# Patient Record
Sex: Female | Born: 1980
Health system: Southern US, Community
[De-identification: ages and names within clinical notes are randomized; demographics above are authoritative.]

## PROBLEM LIST (undated history)

## (undated) DIAGNOSIS — N809 Endometriosis, unspecified: Secondary | ICD-10-CM

## (undated) DIAGNOSIS — Z973 Presence of spectacles and contact lenses: Secondary | ICD-10-CM

## (undated) DIAGNOSIS — K219 Gastro-esophageal reflux disease without esophagitis: Secondary | ICD-10-CM

## (undated) DIAGNOSIS — N840 Polyp of corpus uteri: Secondary | ICD-10-CM

## (undated) DIAGNOSIS — Z87410 Personal history of cervical dysplasia: Secondary | ICD-10-CM

## (undated) HISTORY — PX: WISDOM TOOTH EXTRACTION: SHX21

---

## 1999-02-11 ENCOUNTER — Emergency Department (HOSPITAL_COMMUNITY): Admission: EM | Admit: 1999-02-11 | Discharge: 1999-02-11 | Payer: Self-pay | Admitting: Emergency Medicine

## 1999-04-09 ENCOUNTER — Emergency Department (HOSPITAL_COMMUNITY): Admission: EM | Admit: 1999-04-09 | Discharge: 1999-04-09 | Payer: Self-pay | Admitting: Emergency Medicine

## 1999-05-01 ENCOUNTER — Emergency Department (HOSPITAL_COMMUNITY): Admission: EM | Admit: 1999-05-01 | Discharge: 1999-05-01 | Payer: Self-pay | Admitting: Emergency Medicine

## 1999-05-01 ENCOUNTER — Encounter: Payer: Self-pay | Admitting: Emergency Medicine

## 1999-09-14 ENCOUNTER — Emergency Department (HOSPITAL_COMMUNITY): Admission: EM | Admit: 1999-09-14 | Discharge: 1999-09-14 | Payer: Self-pay | Admitting: Emergency Medicine

## 2000-03-18 ENCOUNTER — Emergency Department (HOSPITAL_COMMUNITY): Admission: EM | Admit: 2000-03-18 | Discharge: 2000-03-18 | Payer: Self-pay | Admitting: Emergency Medicine

## 2000-05-10 ENCOUNTER — Emergency Department (HOSPITAL_COMMUNITY): Admission: EM | Admit: 2000-05-10 | Discharge: 2000-05-10 | Payer: Self-pay | Admitting: Emergency Medicine

## 2001-05-10 ENCOUNTER — Emergency Department (HOSPITAL_COMMUNITY): Admission: EM | Admit: 2001-05-10 | Discharge: 2001-05-10 | Payer: Self-pay | Admitting: Emergency Medicine

## 2001-09-21 ENCOUNTER — Other Ambulatory Visit: Admission: RE | Admit: 2001-09-21 | Discharge: 2001-09-21 | Payer: Self-pay | Admitting: Family Medicine

## 2002-05-07 ENCOUNTER — Emergency Department (HOSPITAL_COMMUNITY): Admission: EM | Admit: 2002-05-07 | Discharge: 2002-05-07 | Payer: Self-pay | Admitting: *Deleted

## 2002-07-29 ENCOUNTER — Encounter: Payer: Self-pay | Admitting: Family Medicine

## 2002-07-29 ENCOUNTER — Ambulatory Visit (HOSPITAL_COMMUNITY): Admission: RE | Admit: 2002-07-29 | Discharge: 2002-07-29 | Payer: Self-pay | Admitting: Family Medicine

## 2006-02-19 ENCOUNTER — Emergency Department (HOSPITAL_COMMUNITY): Admission: EM | Admit: 2006-02-19 | Discharge: 2006-02-19 | Payer: Self-pay | Admitting: Family Medicine

## 2006-05-15 ENCOUNTER — Emergency Department (HOSPITAL_COMMUNITY): Admission: EM | Admit: 2006-05-15 | Discharge: 2006-05-15 | Payer: Self-pay | Admitting: Emergency Medicine

## 2006-07-13 ENCOUNTER — Emergency Department (HOSPITAL_COMMUNITY): Admission: EM | Admit: 2006-07-13 | Discharge: 2006-07-13 | Payer: Self-pay | Admitting: Emergency Medicine

## 2006-07-15 ENCOUNTER — Emergency Department (HOSPITAL_COMMUNITY): Admission: EM | Admit: 2006-07-15 | Discharge: 2006-07-15 | Payer: Self-pay | Admitting: Family Medicine

## 2006-08-27 ENCOUNTER — Encounter: Admission: RE | Admit: 2006-08-27 | Discharge: 2006-08-27 | Payer: Self-pay | Admitting: Internal Medicine

## 2008-01-22 ENCOUNTER — Encounter: Admission: RE | Admit: 2008-01-22 | Discharge: 2008-01-22 | Payer: Self-pay | Admitting: Obstetrics and Gynecology

## 2009-05-02 ENCOUNTER — Emergency Department (HOSPITAL_COMMUNITY): Admission: EM | Admit: 2009-05-02 | Discharge: 2009-05-02 | Payer: Self-pay | Admitting: Emergency Medicine

## 2010-04-27 ENCOUNTER — Ambulatory Visit (HOSPITAL_COMMUNITY): Admission: RE | Admit: 2010-04-27 | Discharge: 2010-04-27 | Payer: Self-pay | Admitting: Obstetrics and Gynecology

## 2010-09-16 ENCOUNTER — Encounter: Payer: Self-pay | Admitting: Obstetrics and Gynecology

## 2010-09-16 ENCOUNTER — Encounter: Payer: Self-pay | Admitting: Obstetrics & Gynecology

## 2011-06-11 ENCOUNTER — Other Ambulatory Visit: Payer: Self-pay | Admitting: Obstetrics & Gynecology

## 2011-06-11 NOTE — Patient Instructions (Addendum)
   Your procedure is scheduled on: Tuesday October 23rd  Enter through the Main Entrance of [facility] at: 11:30 am Pick up the phone at the desk and dial [phone] and inform us of your arrival.  Please call this number if you have any problems the morning of surgery: [phone]  Remember: Do not eat food after midnight: Do not drink clear liquids after:9am Tuesday Take these medicines the morning of surgery with a SIP OF WATER:none  Do not wear jewelry, make-up, or FINGER nail polish Do not wear lotions, powders, deodorants, or perfumes. Do not shave 48 hours prior to surgery. Do not bring valuables to the hospital.  Leave suitcase in the car. After Surgery it may be brought to your room. For patients being admitted to the hospital, checkout time is 11:00am the day of discharge.      Remember to use your hibiclens as instructed.Please shower with 1/2 bottle the evening before your surgery and the other 1/2 bottle the morning of surgery.

## 2011-06-17 ENCOUNTER — Encounter (HOSPITAL_COMMUNITY): Payer: Self-pay

## 2011-06-17 ENCOUNTER — Encounter (HOSPITAL_COMMUNITY)
Admission: RE | Admit: 2011-06-17 | Discharge: 2011-06-17 | Disposition: A | Discharge status: Home / Self Care | Payer: Managed Care, Other (non HMO) | Source: Ambulatory Visit | Attending: Obstetrics & Gynecology | Admitting: Obstetrics & Gynecology

## 2011-06-17 HISTORY — DX: Gastro-esophageal reflux disease without esophagitis: K21.9

## 2011-06-17 LAB — CBC
HCT: 35.4 % — ABNORMAL LOW (ref 36.0–46.0)
MCHC: 32.5 g/dL (ref 30.0–36.0)
RDW: 13.5 % (ref 11.5–15.5)
WBC: 7.5 10*3/uL (ref 4.0–10.5)

## 2011-06-17 LAB — SURGICAL PCR SCREEN: MRSA, PCR: NEGATIVE

## 2011-06-18 ENCOUNTER — Encounter (HOSPITAL_COMMUNITY): Payer: Self-pay | Admitting: *Deleted

## 2011-06-18 ENCOUNTER — Encounter (HOSPITAL_COMMUNITY): Payer: Self-pay | Admitting: Anesthesiology

## 2011-06-18 ENCOUNTER — Encounter (HOSPITAL_COMMUNITY)
Admission: RE | Disposition: A | Discharge status: Home / Self Care | Payer: Self-pay | Source: Ambulatory Visit | Attending: Obstetrics & Gynecology

## 2011-06-18 ENCOUNTER — Other Ambulatory Visit: Payer: Self-pay | Admitting: Obstetrics & Gynecology

## 2011-06-18 ENCOUNTER — Ambulatory Visit (HOSPITAL_COMMUNITY): Payer: Managed Care, Other (non HMO) | Admitting: Anesthesiology

## 2011-06-18 ENCOUNTER — Ambulatory Visit (HOSPITAL_COMMUNITY)
Admission: RE | Admit: 2011-06-18 | Discharge: 2011-06-18 | Disposition: A | Discharge status: Home / Self Care | Payer: Managed Care, Other (non HMO) | Source: Ambulatory Visit | Attending: Obstetrics & Gynecology | Admitting: Obstetrics & Gynecology

## 2011-06-18 DIAGNOSIS — N803 Endometriosis of pelvic peritoneum, unspecified: Secondary | ICD-10-CM | POA: Insufficient documentation

## 2011-06-18 DIAGNOSIS — Z01818 Encounter for other preprocedural examination: Secondary | ICD-10-CM | POA: Insufficient documentation

## 2011-06-18 DIAGNOSIS — N946 Dysmenorrhea, unspecified: Secondary | ICD-10-CM | POA: Insufficient documentation

## 2011-06-18 DIAGNOSIS — K6289 Other specified diseases of anus and rectum: Secondary | ICD-10-CM | POA: Insufficient documentation

## 2011-06-18 DIAGNOSIS — Z01812 Encounter for preprocedural laboratory examination: Secondary | ICD-10-CM | POA: Insufficient documentation

## 2011-06-18 HISTORY — PX: OTHER SURGICAL HISTORY: SHX169

## 2011-06-18 LAB — PREGNANCY, URINE: Preg Test, Ur: NEGATIVE

## 2011-06-18 SURGERY — ROBOTIC ASSISTED LAPAROSCOPIC OVARIAN CYSTECTOMY
Anesthesia: General | Site: Abdomen | Wound class: Clean Contaminated

## 2011-06-18 MED ORDER — SUFENTANIL CITRATE 50 MCG/ML IV SOLN
INTRAVENOUS | Status: DC | PRN
  Administered 2011-06-18 (×2): 15 ug via INTRAVENOUS
  Administered 2011-06-18 (×2): 10 ug via INTRAVENOUS

## 2011-06-18 MED ORDER — GLYCOPYRROLATE 0.2 MG/ML IJ SOLN
INTRAMUSCULAR | Status: DC | PRN
  Administered 2011-06-18: .8 mg via INTRAVENOUS

## 2011-06-18 MED ORDER — ONDANSETRON HCL 4 MG/2ML IJ SOLN
INTRAMUSCULAR | Status: DC | PRN
  Administered 2011-06-18: 4 mg via INTRAVENOUS

## 2011-06-18 MED ORDER — DEXAMETHASONE SODIUM PHOSPHATE 10 MG/ML IJ SOLN
INTRAMUSCULAR | Status: AC
  Filled 2011-06-18: qty 1

## 2011-06-18 MED ORDER — CEFAZOLIN SODIUM 1-5 GM-% IV SOLN: 1.0000 g | INTRAVENOUS | Status: DC

## 2011-06-18 MED ORDER — FENTANYL CITRATE 0.05 MG/ML IJ SOLN
INTRAMUSCULAR | Status: AC
Start: 2011-06-18 — End: 2011-06-18
  Administered 2011-06-18: 25 ug via INTRAVENOUS
  Filled 2011-06-18: qty 2

## 2011-06-18 MED ORDER — ACETAMINOPHEN 325 MG PO TABS: 325.0000 mg | ORAL_TABLET | ORAL | Status: DC | PRN

## 2011-06-18 MED ORDER — ROCURONIUM BROMIDE 100 MG/10ML IV SOLN
INTRAVENOUS | Status: DC | PRN
  Administered 2011-06-18: 10 mg via INTRAVENOUS
  Administered 2011-06-18: 50 mg via INTRAVENOUS
  Administered 2011-06-18: 10 mg via INTRAVENOUS

## 2011-06-18 MED ORDER — SUFENTANIL CITRATE 50 MCG/ML IV SOLN
INTRAVENOUS | Status: AC
  Filled 2011-06-18: qty 1

## 2011-06-18 MED ORDER — ROCURONIUM BROMIDE 50 MG/5ML IV SOLN
INTRAVENOUS | Status: AC
  Filled 2011-06-18: qty 1

## 2011-06-18 MED ORDER — PROMETHAZINE HCL 25 MG/ML IJ SOLN: 6.2500 mg | INTRAMUSCULAR | Status: DC | PRN

## 2011-06-18 MED ORDER — LIDOCAINE HCL (CARDIAC) 20 MG/ML IV SOLN
INTRAVENOUS | Status: AC
  Filled 2011-06-18: qty 5

## 2011-06-18 MED ORDER — DEXAMETHASONE SODIUM PHOSPHATE 10 MG/ML IJ SOLN
INTRAMUSCULAR | Status: DC | PRN
  Administered 2011-06-18: 10 mg via INTRAVENOUS

## 2011-06-18 MED ORDER — HETASTARCH-ELECTROLYTES 6 % IV SOLN
INTRAVENOUS | Status: DC | PRN
  Administered 2011-06-18: 14:00:00 via INTRAVENOUS

## 2011-06-18 MED ORDER — ONDANSETRON HCL 4 MG/2ML IJ SOLN
INTRAMUSCULAR | Status: AC
  Filled 2011-06-18: qty 2

## 2011-06-18 MED ORDER — BUPIVACAINE HCL (PF) 0.25 % IJ SOLN
INTRAMUSCULAR | Status: DC | PRN
  Administered 2011-06-18: 16 mL

## 2011-06-18 MED ORDER — HETASTARCH-ELECTROLYTES 6 % IV SOLN
INTRAVENOUS | Status: AC
  Filled 2011-06-18: qty 500

## 2011-06-18 MED ORDER — LACTATED RINGERS IR SOLN
Status: DC | PRN
  Administered 2011-06-18: 3000 mL

## 2011-06-18 MED ORDER — METHYLENE BLUE 1 % INJ SOLN
INTRAMUSCULAR | Status: DC | PRN
  Administered 2011-06-18: 1 mL

## 2011-06-18 MED ORDER — PROPOFOL 10 MG/ML IV EMUL
INTRAVENOUS | Status: DC | PRN
  Administered 2011-06-18: 150 mg via INTRAVENOUS
  Administered 2011-06-18: 20 mg via INTRAVENOUS

## 2011-06-18 MED ORDER — LACTATED RINGERS IV SOLN
INTRAVENOUS | Status: DC
  Administered 2011-06-18 (×3): via INTRAVENOUS

## 2011-06-18 MED ORDER — NEOSTIGMINE METHYLSULFATE 1 MG/ML IJ SOLN
INTRAMUSCULAR | Status: DC | PRN
  Administered 2011-06-18: 4 mg via INTRAVENOUS

## 2011-06-18 MED ORDER — OXYCODONE-ACETAMINOPHEN 7.5-325 MG PO TABS: 1.0000 | ORAL_TABLET | ORAL | Status: AC | PRN

## 2011-06-18 MED ORDER — FENTANYL CITRATE 0.05 MG/ML IJ SOLN
25.0000 ug | INTRAMUSCULAR | Status: DC | PRN
  Administered 2011-06-18 (×4): 25 ug via INTRAVENOUS

## 2011-06-18 MED ORDER — KETOROLAC TROMETHAMINE 30 MG/ML IJ SOLN
INTRAMUSCULAR | Status: DC | PRN
  Administered 2011-06-18: 30 mg via INTRAVENOUS

## 2011-06-18 MED ORDER — PROPOFOL 10 MG/ML IV EMUL
INTRAVENOUS | Status: AC
  Filled 2011-06-18: qty 40

## 2011-06-18 MED ORDER — MIDAZOLAM HCL 5 MG/5ML IJ SOLN
INTRAMUSCULAR | Status: DC | PRN
  Administered 2011-06-18: 2 mg via INTRAVENOUS

## 2011-06-18 MED ORDER — KETOROLAC TROMETHAMINE 30 MG/ML IJ SOLN: 15.0000 mg | Freq: Once | INTRAMUSCULAR | Status: DC | PRN

## 2011-06-18 MED ORDER — CEFAZOLIN SODIUM 1-5 GM-% IV SOLN
INTRAVENOUS | Status: AC
  Administered 2011-06-18: 1 g via INTRAVENOUS
  Filled 2011-06-18: qty 50

## 2011-06-18 MED ORDER — NEOSTIGMINE METHYLSULFATE 1 MG/ML IJ SOLN
INTRAMUSCULAR | Status: AC
  Filled 2011-06-18: qty 10

## 2011-06-18 MED ORDER — KETOROLAC TROMETHAMINE 30 MG/ML IJ SOLN
INTRAMUSCULAR | Status: AC
  Filled 2011-06-18: qty 1

## 2011-06-18 MED ORDER — MIDAZOLAM HCL 2 MG/2ML IJ SOLN
INTRAMUSCULAR | Status: AC
  Filled 2011-06-18: qty 2

## 2011-06-18 MED ORDER — LIDOCAINE HCL (CARDIAC) 20 MG/ML IV SOLN
INTRAVENOUS | Status: DC | PRN
  Administered 2011-06-18: 60 mg via INTRAVENOUS

## 2011-06-18 MED ORDER — GLYCOPYRROLATE 0.2 MG/ML IJ SOLN
INTRAMUSCULAR | Status: AC
  Filled 2011-06-18: qty 2

## 2011-06-18 SURGICAL SUPPLY — 47 items
ADH SKN CLS APL DERMABOND .7 (GAUZE/BANDAGES/DRESSINGS) ×3
BARRIER ADHS 3X4 INTERCEED (GAUZE/BANDAGES/DRESSINGS) ×3 IMPLANT
BRR ADH 4X3 ABS CNTRL BYND (GAUZE/BANDAGES/DRESSINGS) ×2
CABLE HIGH FREQUENCY MONO STRZ (ELECTRODE) ×2 IMPLANT
CLOTH BEACON ORANGE TIMEOUT ST (SAFETY) ×2 IMPLANT
CONT PATH 16OZ SNAP LID 3702 (MISCELLANEOUS) ×2 IMPLANT
COVER MAYO STAND STRL (DRAPES) ×2 IMPLANT
COVER TABLE BACK 60X90 (DRAPES) ×4 IMPLANT
COVER TIP SHEARS 8 DVNC (MISCELLANEOUS) ×1 IMPLANT
COVER TIP SHEARS 8MM DA VINCI (MISCELLANEOUS) ×1
DECANTER SPIKE VIAL GLASS SM (MISCELLANEOUS) ×2 IMPLANT
DERMABOND ADVANCED (GAUZE/BANDAGES/DRESSINGS) ×3
DERMABOND ADVANCED .7 DNX12 (GAUZE/BANDAGES/DRESSINGS) IMPLANT
DRAPE HUG U DISPOSABLE (DRAPE) ×2 IMPLANT
DRAPE LG THREE QUARTER DISP (DRAPES) ×4 IMPLANT
DRAPE MONITOR DA VINCI (DRAPE) ×2 IMPLANT
DRAPE WARM FLUID 44X44 (DRAPE) ×2 IMPLANT
ELECT REM PT RETURN 9FT ADLT (ELECTROSURGICAL) ×2
ELECTRODE REM PT RTRN 9FT ADLT (ELECTROSURGICAL) ×1 IMPLANT
EVACUATOR SMOKE 8.L (FILTER) ×2 IMPLANT
GLOVE BIO SURGEON STRL SZ 6.5 (GLOVE) ×10 IMPLANT
GOWN PREVENTION PLUS LG XLONG (DISPOSABLE) ×16 IMPLANT
KIT DISP ACCESSORY 4 ARM (KITS) ×2 IMPLANT
MATRIX HEMOSTAT SURGIFLO (HEMOSTASIS) ×2 IMPLANT
PACK LAVH (CUSTOM PROCEDURE TRAY) ×2 IMPLANT
PAD PREP 24X48 CUFFED NSTRL (MISCELLANEOUS) ×4 IMPLANT
POSITIONER SURGICAL ARM (MISCELLANEOUS) ×4 IMPLANT
SET IRRIG TUBING LAPAROSCOPIC (IRRIGATION / IRRIGATOR) ×3 IMPLANT
SOLUTION ELECTROLUBE (MISCELLANEOUS) ×2 IMPLANT
SURGIFLO TRUKIT (HEMOSTASIS) ×2 IMPLANT
SUT VIC AB 3-0 SH 27 (SUTURE)
SUT VIC AB 3-0 SH 27X BRD (SUTURE) IMPLANT
SUT VIC AB 4-0 PS2 27 (SUTURE) ×4 IMPLANT
SUT VICRYL 0 UR6 27IN ABS (SUTURE) ×4 IMPLANT
SYR 50ML LL SCALE MARK (SYRINGE) ×3 IMPLANT
SYSTEM CONVERTIBLE TROCAR (TROCAR) ×1 IMPLANT
TIP UTERINE 6.7X6CM WHT DISP (MISCELLANEOUS) IMPLANT
TIP UTERINE 6.7X8CM BLUE DISP (MISCELLANEOUS) ×1 IMPLANT
TOWEL OR 17X24 6PK STRL BLUE (TOWEL DISPOSABLE) ×6 IMPLANT
TRAY FOLEY BAG SILVER LF 14FR (CATHETERS) ×2 IMPLANT
TROCAR 12M 150ML BLUNT (TROCAR) ×1 IMPLANT
TROCAR DISP BLADELESS 8 DVNC (TROCAR) ×1 IMPLANT
TROCAR DISP BLADELESS 8MM (TROCAR) ×1
TROCAR XCEL NON-BLD 5MMX100MML (ENDOMECHANICALS) ×2 IMPLANT
TUBING FILTER THERMOFLATOR (ELECTROSURGICAL) ×4 IMPLANT
WARMER LAPAROSCOPE (MISCELLANEOUS) ×2 IMPLANT
WATER STERILE IRR 1000ML POUR (IV SOLUTION) ×6 IMPLANT

## 2011-06-18 NOTE — Op Note (Signed)
06/18/2011  2:54 PM  PATIENT:  Kristine Cummings  30 y.o. female  PRE-OPERATIVE DIAGNOSIS:  Dysmenorrhea, Rectal Pain, Possible Endometriosis  POST-OPERATIVE DIAGNOSIS:  Dysmenorrhea, Rectal Pain, Stage 3 Endometriosis with adhesions at Cecum and Appendix.  PROCEDURE:  Procedure(s): DX LPS, ROBOTIC ASSISTED LAPAROSCOPIC RESECTION OF ENDOMETRIOSIS AND LYSIS OF CECAL AND APPENDICEAL ADHESIONS.  SURGEON:  Surgeon(s): [name] Domanique Luckett Sheronette Cathie Beams, MD  ASSISTANTS: Serita Kyle, MD   ANESTHESIA:   general  Procedure: Under general anesthesia with endotracheal intubation. The patient is in lithotomy position. She is prepped with Betadine on the abdominal, suprapubic, vulvar and vaginal areas. She is draped as usual.  On vaginal exam the uterus is anteverted normal volume but nodules are palpable in the posterior Cul-de-sac.  No adnexal mass is felt.  The small Ko-ring with a #8 roomy are put in place without difficulty. The supra-umbilical area is infiltrated with Marcaine one quarter plain. A 1.5 cm incision is made with a scalpel at that level.  The aponeurosis is opened with Mayo scissors and the parietal peritoneum is opened with Mayo scissors as well. A pursestring stitch of Vicryl 0 is applied at the aponeurosis. The Roseanne Reno is inserted at that level. A pneumoperitoneum is created with CO2. The camera is inserted.  We note lesions of endometriosis at the level of the uterosacral ligaments. The appendix is adherent to the right adnexa. The decision is therefore taken to on use robotic assistance. A semicircular configuration is used for port placement. Each site is infiltrated with Marcaine one quarter plain. The scalpel was used to make 5 and 8mm incisions. 2 robotic ports are inserted on the right, one robotic port on the lower left and the assistant port in the upper left.  All trochars are inserted under direct vision. The robot his docked from the right side without difficulty. We  then go to the console. The Endo Shears scissor is used and the right arm, the PK in the left arm and the Prograsp in the third arm.  The appendix is freed from the right adnexa via meticulous sharp dissection.  No lesion of endometriosis is noted on the appendix. At the site where the appendix was released a deep lesion of endometriosis measuring 2 cm is present. This lesion lies between the right round ligament and the right tube.  It is completely ressected. We then proceed with resection of a deep right anterior lesion.  This lesion measures about 2. It was completely ressected.  We then addressed the posterior cul-de-sac. The posterior cul-de-sac is partially obliterated.  The Cecum is coming up densely adherent to the back of the uterus all the way above the uterosacral ligaments.  We start by resecting the lesion of endometriosis above the site of adhesion of the cecum.  A 3 cm deep lesion of endometriosis from the left uterosacral to the right was ressected.  The cecum was then safely descended as low as possible. An EEA was used to stay safely away from the cecal wall. At the end of the lysis of adhesions the cecum was in normal anatomic position in terms of being descended but it was somewhat loopy.  A per-op consultation with general surgery was requested to allow them to see the current position of the cecum in case future re-intervention is necessary, but Dr. Dwain Sarna called back stating that he was not available.  A small 1 cm superficial lesion of endometriosis was also ressected from the small bowel on the right.  The score for endometriosis was 18, per the revised scoring system of the fertility Society.  That gave a stage III pelvic endometriosis. We performed Methylene Blue hydrotubation. The tubes were patent on both sides.  No lesion was were present on the ovaries. And the uterus was otherwise normal. The liver and the rest of the abdomen were normal. We irrigated and suctioned the  abdominopelvic cavities. Hemostasis was adequate at all levels. We therefore removed the robotic instruments, undocked the robot.  We then went by laparoscopy. Suctioned the pelvic cavity further after removing the Trendelenburg. We used Surgi-Flo for hemostasis. And then Interceed for adhesion barrier.  Pictures were taken before and after the resection of endometriosis and lysis of adhesions. All biopsy specimens were sent to pathology. The instruments were removed. All ports were removed under direct vision. The CO2 was evacuated. The pursestring stitch was attached at the supraumbilical incision. We used Monocril 4-0 in a subcuticular stitch to close all incisions.  We added Dermabond on top of each incision. The instruments were removed from the vagina. The patient was brought to the PACU in good and stable condition.  ESTIMATED BLOOD LOSS: 75 cc   Intake/Output Summary (Last 24 hours) at 06/18/11 1454 Last data filed at 06/18/11 1443  Gross per 24 hour  Intake   1500 ml  Output    105 ml  Net   1395 ml     BLOOD ADMINISTERED:none   LOCAL MEDICATIONS USED:  MARCAIN 20 CC  SPECIMEN:  Source of Specimen:  Pelvic Endometriotic lesions, excision biopsies.  DISPOSITION OF SPECIMEN:  PATHOLOGY  COUNTS:  YES   PLAN OF CARE: Transfer to PACU    Genia Del MD

## 2011-06-18 NOTE — Discharge Summary (Signed)
  Physician Discharge Summary  Patient ID: Kristine Cummings MRN: [id number] DOB/AGE: [birth year] 31 y.o.  Admit date: 06/18/2011 Discharge date: 06/18/2011  Admission Diagnoses: Dysmenorrhea, Rectal Pain, Possible Endometriosis  Discharge Diagnoses: Dysmenorrhea, Rectal Pain, Possible Endometriosis        Active Problems:  * No active hospital problems. *    Discharged Condition: good  Hospital Course:   Consults: general surgery per op (Phone discussion, Dr Dwain Sarna not able to come) Treatments: surgery  Disposition: Final discharge disposition not confirmed   Current Discharge Medication List    START taking these medications   Details  oxyCODONE-acetaminophen (PERCOCET) 7.5-325 MG per tablet Take 1 tablet by mouth every 4 (four) hours as needed for pain. Qty: 30 tablet, Refills: 0       Follow-up Information    Follow up with [name],[name] in 3 weeks.   Contact information:   [address] [place] [address] [phone]          Signed: Genia Del 06/18/2011, 3:31 PM

## 2011-06-18 NOTE — H&P (Signed)
Kristine Cummings is an 30 y.o. female G0  RP:  Pelvic pain with painful BMs and dysmenorrhea         R/O pelvic endometriosis.  Dx LPS, possible da Vinci assisted conservative Rx of endometriosis, lysis of adhesions.  Pertinent Gynecological History: Menses: flow is moderate Contraception: condoms Blood transfusions: none Sexually transmitted diseases: no past history, except h/o CIN 1 in 2005-2006. Previous GYN Procedures: None  Last pap: wnl  OB History: G0   Menstrual History:  No LMP recorded.    Past Medical History  Diagnosis Date  . GERD (gastroesophageal reflux disease)     uses otc nexium if needed    Past Surgical History  Procedure Date  . Wisdom tooth extraction     as teen    No family history on file.  Social History:  reports that she has been smoking Cigarettes.  She has a 8 pack-year smoking history. She does not have any smokeless tobacco history on file. She reports that she drinks alcohol. She reports that she does not use illicit drugs.  Allergies: No Known Allergies  No prescriptions prior to admission    Blood pressure 109/58, pulse 66, temperature 98.1 F (36.7 C), temperature source Oral, resp. rate 18, SpO2 100.00%.  Results for orders placed during the hospital encounter of 06/17/11 (from the past 24 hour(s))  SURGICAL PCR SCREEN     Status: Normal   Collection Time   06/17/11  3:40 PM      Component Value Range   MRSA, PCR NEGATIVE  NEGATIVE    Staphylococcus aureus NEGATIVE  NEGATIVE   CBC     Status: Abnormal   Collection Time   06/17/11  3:40 PM      Component Value Range   WBC 7.5  4.0 - 10.5 (K/uL)   RBC 4.07  3.87 - 5.11 (MIL/uL)   Hemoglobin 11.5 (*) 12.0 - 15.0 (g/dL)   HCT 95.6 (*) 21.3 - 46.0 (%)   MCV 87.0  78.0 - 100.0 (fL)   MCH 28.3  26.0 - 34.0 (pg)   MCHC 32.5  30.0 - 36.0 (g/dL)   RDW 08.6  57.8 - 46.9 (%)   Platelets 245  150 - 400 (K/uL)    No results found.  Pelvic US neg 06/04/11  Assessment/Plan: Pelvic  pain, pain with BMs and dysmenorrhea.  Dx LPS, conservative Rx of endometriosis, possible lysis of adhesion.  Surgery and risks reviewed.  Nikola Blackston,[name] 06/18/2011, 11:42 AM

## 2011-06-18 NOTE — Transfer of Care (Signed)
Immediate Anesthesia Transfer of Care Note  Patient: Kristine Cummings  Procedure(s) Performed:  ROBOTIC ASSISTED LAPAROSCOPIC OVARIAN CYSTECTOMY  Patient Location: PACU  Anesthesia Type: General  Level of Consciousness: oriented and sedated  Airway & Oxygen Therapy: Patient Spontanous Breathing and Patient connected to nasal cannula oxygen  Post-op Assessment: Report given to PACU RN and Post -op Vital signs reviewed and stable  Post vital signs: stable  Complications: No apparent anesthesia complications

## 2011-06-18 NOTE — Anesthesia Postprocedure Evaluation (Signed)
Anesthesia Post Note  Patient: Kristine Cummings  Procedure(s) Performed:  ROBOTIC ASSISTED LAPAROSCOPIC OVARIAN CYSTECTOMY  Anesthesia type: General  Patient location: PACU  Post pain: Pain level controlled  Post assessment: Post-op Vital signs reviewed  Last Vitals:  Filed Vitals:   06/18/11 1615  BP: 102/59  Pulse: 70  Temp:   Resp: 21    Post vital signs: Reviewed  Level of consciousness: sedated  Complications: No apparent anesthesia complicationsfj

## 2011-06-18 NOTE — Anesthesia Preprocedure Evaluation (Addendum)
Anesthesia Evaluation  Patient identified by MRN, date of birth, ID band Patient awake  General Assessment Comment  Reviewed: Allergy & Precautions, H&P , Patient's Chart, lab work & pertinent test results, reviewed documented beta blocker date and time   History of Anesthesia Complications Negative for: history of anesthetic complications  Airway Mallampati: II TM Distance: >3 FB Neck ROM: full    Dental No notable dental hx.    Pulmonary  clear to auscultation  Pulmonary exam normal       Cardiovascular Exercise Tolerance: Good regular Normal    Neuro/Psych Negative Neurological ROS  Negative Psych ROS   GI/Hepatic negative GI ROS Neg liver ROS  GERD   Endo/Other  Negative Endocrine ROS  Renal/GU negative Renal ROS     Musculoskeletal   Abdominal   Peds  Hematology negative hematology ROS (+)   Anesthesia Other Findings smoker  Reproductive/Obstetrics negative OB ROS                           Anesthesia Physical Anesthesia Plan  ASA: II  Anesthesia Plan: General   Post-op Pain Management:    Induction:   Airway Management Planned:   Additional Equipment:   Intra-op Plan:   Post-operative Plan:   Informed Consent: I have reviewed the patients History and Physical, chart, labs and discussed the procedure including the risks, benefits and alternatives for the proposed anesthesia with the patient or authorized representative who has indicated his/her understanding and acceptance.   Dental Advisory Given  Plan Discussed with: CRNA and Surgeon  Anesthesia Plan Comments:        Anesthesia Quick Evaluation

## 2012-06-02 ENCOUNTER — Other Ambulatory Visit: Payer: Self-pay | Admitting: Obstetrics and Gynecology

## 2012-06-02 DIAGNOSIS — N63 Unspecified lump in unspecified breast: Secondary | ICD-10-CM

## 2012-06-08 ENCOUNTER — Ambulatory Visit
Admission: RE | Admit: 2012-06-08 | Discharge: 2012-06-08 | Disposition: A | Discharge status: Home / Self Care | Payer: Managed Care, Other (non HMO) | Source: Ambulatory Visit | Attending: Obstetrics and Gynecology | Admitting: Obstetrics and Gynecology

## 2012-06-08 DIAGNOSIS — N63 Unspecified lump in unspecified breast: Secondary | ICD-10-CM

## 2014-05-26 ENCOUNTER — Emergency Department (INDEPENDENT_AMBULATORY_CARE_PROVIDER_SITE_OTHER)
Admission: EM | Admit: 2014-05-26 | Discharge: 2014-05-26 | Disposition: A | Discharge status: Home / Self Care | Payer: Self-pay | Source: Home / Self Care | Attending: Family Medicine | Admitting: Family Medicine

## 2014-05-26 ENCOUNTER — Encounter (HOSPITAL_COMMUNITY): Payer: Self-pay | Admitting: Emergency Medicine

## 2014-05-26 DIAGNOSIS — Z041 Encounter for examination and observation following transport accident: Secondary | ICD-10-CM

## 2014-05-26 MED ORDER — CYCLOBENZAPRINE HCL 5 MG PO TABS: 5.0000 mg | ORAL_TABLET | Freq: Three times a day (TID) | ORAL | Status: DC | PRN

## 2014-05-26 MED ORDER — IBUPROFEN 800 MG PO TABS: 800.0000 mg | ORAL_TABLET | Freq: Three times a day (TID) | ORAL | Status: DC
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
## 2014-08-22 ENCOUNTER — Other Ambulatory Visit: Payer: Self-pay | Admitting: Obstetrics and Gynecology

## 2014-08-22 DIAGNOSIS — N6310 Unspecified lump in the right breast, unspecified quadrant: Secondary | ICD-10-CM

## 2014-08-31 ENCOUNTER — Other Ambulatory Visit: Payer: Managed Care, Other (non HMO)

## 2014-08-31 ENCOUNTER — Other Ambulatory Visit: Payer: Self-pay | Admitting: Obstetrics and Gynecology

## 2014-08-31 ENCOUNTER — Ambulatory Visit
Admission: RE | Admit: 2014-08-31 | Discharge: 2014-08-31 | Disposition: A | Discharge status: Home / Self Care | Payer: BLUE CROSS/BLUE SHIELD | Source: Ambulatory Visit

## 2014-08-31 ENCOUNTER — Ambulatory Visit
Admission: RE | Admit: 2014-08-31 | Discharge: 2014-08-31 | Disposition: A | Discharge status: Home / Self Care | Payer: BLUE CROSS/BLUE SHIELD | Source: Ambulatory Visit | Attending: Obstetrics and Gynecology | Admitting: Obstetrics and Gynecology

## 2014-08-31 DIAGNOSIS — N6320 Unspecified lump in the left breast, unspecified quadrant: Secondary | ICD-10-CM

## 2014-08-31 DIAGNOSIS — N6310 Unspecified lump in the right breast, unspecified quadrant: Secondary | ICD-10-CM

## 2014-10-25 HISTORY — PX: OTHER SURGICAL HISTORY: SHX169

## 2015-02-17 ENCOUNTER — Encounter (HOSPITAL_BASED_OUTPATIENT_CLINIC_OR_DEPARTMENT_OTHER): Payer: Self-pay | Admitting: *Deleted

## 2015-02-17 NOTE — Progress Notes (Signed)
NPO AFTER MN.  ARRIVE AT 1100.  NEEDS HG AND URINE PREG.  WILL TAKE REFLUX MED ( NEXIUM OR PRILOSEC OTC) AM DOS  W/ SIPS OF WATER.

## 2015-02-21 NOTE — H&P (Addendum)
Kristine Cummings is a 34 y.o. female , originally referred to me by Dr. Cherly Hensen, for endometriosis infertility.  She was diagnosed with an endometrial polyp by recent endometrial biopsy. She reports abnormal uterine bleeding x 2 months.  Pertinent Gynecological History: Menses: Normal Bleeding: dysfunctional uterine bleeding Contraception: none DES exposure: denies Blood transfusions: none Sexually transmitted diseases: no past history Previous GYN Procedures: Laparoscopy, TVOR  Last mammogram: normal Last pap: normal  OB History: G 0   Menstrual History: Menarche age: 80 No LMP recorded.    Past Medical History  Diagnosis Date  . Endometrial polyp   . Endometriosis   . History of cervical dysplasia     CIN I  in 2005-2006  . Wears contact lenses   . GERD (gastroesophageal reflux disease)     uses otc nexium or prilosec if needed                    Past Surgical History  Procedure Laterality Date  . Wisdom tooth extraction  as teen  . Robotic assisted laparoscopy / resection endometriosis/  lysis cecal and appendiceal adhesions  06-18-2011  . Egg retrieval w/ propofol  march 2016             History reviewed. No pertinent family history. No hereditary disease.  No cancer of breast, ovary, uterus. No cutaneous leiomyomatosis or renal cell carcinoma.  History   Social History  . Marital Status: Single    Spouse Name: N/A  . Number of Children: N/A  . Years of Education: N/A   Occupational History  . Not on file.   Social History Main Topics  . Smoking status: Current Every Day Smoker -- 0.00 packs/day for 15 years    Types: Cigars  . Smokeless tobacco: Never Used     Comment: currently smokes 2 cigars daily/  quit 1ppd cigarettes june 2015  . Alcohol Use: Yes     Comment: occasional  . Drug Use: No  . Sexual Activity: Not on file   Other Topics Concern  . Not on file   Social History Narrative    No Known Allergies  No current  facility-administered medications on file prior to encounter.   No current outpatient prescriptions on file prior to encounter.     Review of Systems  Constitutional: Negative.   HENT: Negative.   Eyes: Negative.   Respiratory: Negative.   Cardiovascular: Negative.   Gastrointestinal: Negative.   Genitourinary: Negative.   Musculoskeletal: Negative.   Skin: Negative.   Neurological: Negative.   Endo/Heme/Allergies: Negative.   Psychiatric/Behavioral: Negative.      Physical Exam  Ht  (1.651 m)  Wt 63.957 kg (141 lb)  BMI 23.46 kg/m2  LMP 02/03/2015 (Approximate) Constitutional: She is oriented to person, place, and time. She appears well-developed and well-nourished.  HENT:  Head: Normocephalic and atraumatic.  Nose: Nose normal.  Mouth/Throat: Oropharynx is clear and moist. No oropharyngeal exudate.  Eyes: Conjunctivae normal and EOM are normal. Pupils are equal, round, and reactive to light. No scleral icterus.  Neck: Normal range of motion. Neck supple. No tracheal deviation present. No thyromegaly present.  Cardiovascular: Normal rate.   Respiratory: Effort normal and breath sounds normal.  GI: Soft. Bowel sounds are normal. She exhibits no distension and no mass. There is no tenderness.  Lymphadenopathy:    She has no cervical adenopathy.  Neurological: She is alert and oriented to person, place, and time. She has normal reflexes.  Skin:  Skin is warm.  Psychiatric: She has a normal mood and affect. Her behavior is normal. Judgment and thought content normal.       Assessment/Plan:  Patient will have a hysteroscopy, polypectomy. She will follow up with my office when ready to pursue frozen embryo transfer.

## 2015-02-22 ENCOUNTER — Ambulatory Visit (HOSPITAL_BASED_OUTPATIENT_CLINIC_OR_DEPARTMENT_OTHER)
Admission: RE | Admit: 2015-02-22 | Discharge: 2015-02-22 | Disposition: A | Discharge status: Home / Self Care | Payer: BLUE CROSS/BLUE SHIELD | Source: Ambulatory Visit | Attending: Obstetrics and Gynecology | Admitting: Obstetrics and Gynecology

## 2015-02-22 ENCOUNTER — Encounter (HOSPITAL_BASED_OUTPATIENT_CLINIC_OR_DEPARTMENT_OTHER): Payer: Self-pay

## 2015-02-22 ENCOUNTER — Encounter (HOSPITAL_BASED_OUTPATIENT_CLINIC_OR_DEPARTMENT_OTHER)
Admission: RE | Disposition: A | Discharge status: Home / Self Care | Payer: Self-pay | Source: Ambulatory Visit | Attending: Obstetrics and Gynecology

## 2015-02-22 ENCOUNTER — Ambulatory Visit (HOSPITAL_BASED_OUTPATIENT_CLINIC_OR_DEPARTMENT_OTHER): Payer: BLUE CROSS/BLUE SHIELD | Admitting: Anesthesiology

## 2015-02-22 DIAGNOSIS — K219 Gastro-esophageal reflux disease without esophagitis: Secondary | ICD-10-CM | POA: Insufficient documentation

## 2015-02-22 DIAGNOSIS — F17210 Nicotine dependence, cigarettes, uncomplicated: Secondary | ICD-10-CM | POA: Diagnosis not present

## 2015-02-22 DIAGNOSIS — N840 Polyp of corpus uteri: Secondary | ICD-10-CM | POA: Diagnosis not present

## 2015-02-22 DIAGNOSIS — N870 Mild cervical dysplasia: Secondary | ICD-10-CM | POA: Insufficient documentation

## 2015-02-22 DIAGNOSIS — Z5309 Procedure and treatment not carried out because of other contraindication: Secondary | ICD-10-CM | POA: Insufficient documentation

## 2015-02-22 DIAGNOSIS — N978 Female infertility of other origin: Secondary | ICD-10-CM | POA: Diagnosis not present

## 2015-02-22 HISTORY — DX: Presence of spectacles and contact lenses: Z97.3

## 2015-02-22 HISTORY — DX: Personal history of cervical dysplasia: Z87.410

## 2015-02-22 HISTORY — DX: Endometriosis, unspecified: N80.9

## 2015-02-22 HISTORY — DX: Polyp of corpus uteri: N84.0

## 2015-02-22 LAB — TYPE AND SCREEN
ABO/RH(D): A POS
ANTIBODY SCREEN: NEGATIVE

## 2015-02-22 LAB — ABO/RH: ABO/RH(D): A POS

## 2015-02-22 SURGERY — HYSTEROSCOPY
Anesthesia: General

## 2015-02-22 MED ORDER — LACTATED RINGERS IV SOLN
INTRAVENOUS | Status: DC
  Filled 2015-02-22: qty 1000

## 2015-02-22 MED ORDER — MIDAZOLAM HCL 2 MG/2ML IJ SOLN
INTRAMUSCULAR | Status: AC
  Filled 2015-02-22: qty 2

## 2015-02-22 MED ORDER — CEFAZOLIN SODIUM-DEXTROSE 2-3 GM-% IV SOLR
INTRAVENOUS | Status: AC
  Filled 2015-02-22: qty 50

## 2015-02-22 MED ORDER — CEFAZOLIN SODIUM-DEXTROSE 2-3 GM-% IV SOLR
2.0000 g | INTRAVENOUS | Status: DC
  Filled 2015-02-22: qty 50

## 2015-02-22 MED ORDER — FENTANYL CITRATE (PF) 100 MCG/2ML IJ SOLN
INTRAMUSCULAR | Status: AC
  Filled 2015-02-22: qty 4

## 2015-02-22 SURGICAL SUPPLY — 40 items
CANISTER SUCTION 2500CC (MISCELLANEOUS) ×1 IMPLANT
CANNULA CURETTE W/SYR 6 (CANNULA) IMPLANT
CANNULA CURETTE W/SYR 7 (CANNULA) IMPLANT
CATH HSG 5FRX28CM (CATHETERS) IMPLANT
CATH INTRA ACCESS BALLN (BALLOONS) IMPLANT
CATH ROBINSON RED A/P 16FR (CATHETERS) ×1 IMPLANT
CATH SALPINGOGRAPHY SELECT (BALLOONS) IMPLANT
CATH SSG INJECTION W/GUIDEWIRE (BALLOONS) IMPLANT
CORD ACTIVE DISPOSABLE (ELECTRODE)
CORD ELECTRO ACTIVE DISP (ELECTRODE) IMPLANT
COVER BACK TABLE 60X90IN (DRAPES) ×1 IMPLANT
DRAPE LG THREE QUARTER DISP (DRAPES) IMPLANT
ELECT LOOP GYNE PRO 24FR (CUTTING LOOP)
ELECT REM PT RETURN 9FT ADLT (ELECTROSURGICAL)
ELECT VAPORTRODE GRVD BAR (ELECTRODE) IMPLANT
ELECTRODE KNIFE SHAPED 22FR (ELECTROSURGICAL) IMPLANT
ELECTRODE LOOP GYNE PRO 24FR (CUTTING LOOP) IMPLANT
ELECTRODE REM PT RTRN 9FT ADLT (ELECTROSURGICAL) IMPLANT
GLOVE BIO SURGEON STRL SZ8 (GLOVE) ×1 IMPLANT
GLOVE BIOGEL PI IND STRL 8.5 (GLOVE) ×1 IMPLANT
GLOVE BIOGEL PI INDICATOR 8.5 (GLOVE)
GOWN STRL REUS W/ TWL XL LVL3 (GOWN DISPOSABLE) ×2 IMPLANT
GOWN STRL REUS W/TWL XL LVL3 (GOWN DISPOSABLE)
LEGGING LITHOTOMY PAIR STRL (DRAPES) ×1 IMPLANT
LOOP ANGLED CUTTING 22FR (CUTTING LOOP) IMPLANT
MANIFOLD NEPTUNE II (INSTRUMENTS) IMPLANT
NDL SPNL 22GX3.5 QUINCKE BK (NEEDLE) ×1 IMPLANT
NEEDLE SPNL 22GX3.5 QUINCKE BK (NEEDLE) IMPLANT
PACK BASIN DAY SURGERY FS (CUSTOM PROCEDURE TRAY) ×1 IMPLANT
PAD OB MATERNITY 4.3X12.25 (PERSONAL CARE ITEMS) ×1 IMPLANT
SET IRRIG Y TYPE TUR BLADDER L (SET/KITS/TRAYS/PACK) IMPLANT
STENT BALLN UTERINE 4CM 6FR (STENTS) IMPLANT
SUT SILK 2 0 SH (SUTURE) IMPLANT
SUT SILK 3 0 PS 1 (SUTURE) IMPLANT
SYR 20CC LL (SYRINGE) IMPLANT
SYR 3ML 18GX1 1/2 (SYRINGE) ×1 IMPLANT
TOWEL OR 17X24 6PK STRL BLUE (TOWEL DISPOSABLE) ×2 IMPLANT
TRAY DSU PREP LF (CUSTOM PROCEDURE TRAY) ×1 IMPLANT
TUBE CONNECTING 12X1/4 (SUCTIONS) IMPLANT
WATER STERILE IRR 500ML POUR (IV SOLUTION) ×1 IMPLANT

## 2015-02-22 NOTE — Anesthesia Preprocedure Evaluation (Addendum)
Anesthesia Evaluation  Patient identified by MRN, date of birth, ID band Patient awake    Reviewed: Allergy & Precautions, H&P , Patient's Chart, lab work & pertinent test results, reviewed documented beta blocker date and time   History of Anesthesia Complications Negative for: history of anesthetic complications  Airway Mallampati: II  TM Distance: >3 FB Neck ROM: full    Dental no notable dental hx. (+) Dental Advisory Given, Teeth Intact   Pulmonary neg pulmonary ROS, Current Smoker,  breath sounds clear to auscultation  Pulmonary exam normal       Cardiovascular Exercise Tolerance: Good negative cardio ROS Normal cardiovascular examRhythm:regular Rate:Normal     Neuro/Psych negative neurological ROS  negative psych ROS   GI/Hepatic negative GI ROS, Neg liver ROS, GERD-  Medicated and Controlled,  Endo/Other  negative endocrine ROS  Renal/GU negative Renal ROS     Musculoskeletal   Abdominal   Peds  Hematology negative hematology ROS (+)   Anesthesia Other Findings smoker  Reproductive/Obstetrics negative OB ROS                            Anesthesia Physical Anesthesia Plan  ASA: II  Anesthesia Plan: General   Post-op Pain Management:    Induction: Intravenous, Rapid sequence and Cricoid pressure planned  Airway Management Planned: Oral ETT  Additional Equipment:   Intra-op Plan:   Post-operative Plan: Extubation in OR  Informed Consent:   Plan Discussed with: Surgeon  Anesthesia Plan Comments:        Anesthesia Quick Evaluation

## 2015-02-22 NOTE — Progress Notes (Signed)
Surgery canceled by anesthesia, drank 3 sips per patient of coffee drink with milk ato 1030.

## 2015-02-23 LAB — POCT PREGNANCY, URINE: PREG TEST UR: NEGATIVE

## 2015-03-15 ENCOUNTER — Encounter (HOSPITAL_BASED_OUTPATIENT_CLINIC_OR_DEPARTMENT_OTHER): Payer: Self-pay | Admitting: *Deleted

## 2015-03-15 NOTE — Progress Notes (Signed)
NPO AFTER MN WITH EXCEPTION WATER/ GATORADE UNTIL 0700.  ARRIVE AT 1200.  NEEDS HG AND URINE PREG. WILL TAKE REFLUX MED. AM DOS W/SIPS OF WATER.

## 2015-03-17 ENCOUNTER — Ambulatory Visit (HOSPITAL_BASED_OUTPATIENT_CLINIC_OR_DEPARTMENT_OTHER): Payer: BLUE CROSS/BLUE SHIELD | Admitting: Anesthesiology

## 2015-03-17 ENCOUNTER — Ambulatory Visit (HOSPITAL_BASED_OUTPATIENT_CLINIC_OR_DEPARTMENT_OTHER)
Admission: RE | Admit: 2015-03-17 | Discharge: 2015-03-17 | Disposition: A | Discharge status: Home / Self Care | Payer: BLUE CROSS/BLUE SHIELD | Source: Ambulatory Visit | Attending: Obstetrics and Gynecology | Admitting: Obstetrics and Gynecology

## 2015-03-17 ENCOUNTER — Encounter (HOSPITAL_BASED_OUTPATIENT_CLINIC_OR_DEPARTMENT_OTHER): Payer: Self-pay | Admitting: *Deleted

## 2015-03-17 ENCOUNTER — Encounter (HOSPITAL_BASED_OUTPATIENT_CLINIC_OR_DEPARTMENT_OTHER)
Admission: RE | Disposition: A | Discharge status: Home / Self Care | Payer: Self-pay | Source: Ambulatory Visit | Attending: Obstetrics and Gynecology

## 2015-03-17 DIAGNOSIS — K219 Gastro-esophageal reflux disease without esophagitis: Secondary | ICD-10-CM | POA: Diagnosis not present

## 2015-03-17 DIAGNOSIS — N840 Polyp of corpus uteri: Secondary | ICD-10-CM | POA: Diagnosis not present

## 2015-03-17 DIAGNOSIS — N809 Endometriosis, unspecified: Secondary | ICD-10-CM | POA: Diagnosis not present

## 2015-03-17 DIAGNOSIS — F17210 Nicotine dependence, cigarettes, uncomplicated: Secondary | ICD-10-CM | POA: Insufficient documentation

## 2015-03-17 HISTORY — PX: HYSTEROSCOPY: SHX211

## 2015-03-17 LAB — POCT PREGNANCY, URINE: Preg Test, Ur: NEGATIVE

## 2015-03-17 SURGERY — HYSTEROSCOPY
Anesthesia: General | Site: Uterus

## 2015-03-17 MED ORDER — LACTATED RINGERS IV SOLN
INTRAVENOUS | Status: DC
  Administered 2015-03-17 (×2): via INTRAVENOUS
  Filled 2015-03-17: qty 1000

## 2015-03-17 MED ORDER — CEFAZOLIN SODIUM-DEXTROSE 2-3 GM-% IV SOLR
2.0000 g | INTRAVENOUS | Status: AC
  Administered 2015-03-17: 2 g via INTRAVENOUS
  Filled 2015-03-17: qty 50

## 2015-03-17 MED ORDER — PROMETHAZINE HCL 25 MG/ML IJ SOLN
6.2500 mg | INTRAMUSCULAR | Status: DC | PRN
  Filled 2015-03-17: qty 1

## 2015-03-17 MED ORDER — FENTANYL CITRATE (PF) 100 MCG/2ML IJ SOLN
INTRAMUSCULAR | Status: AC
  Filled 2015-03-17: qty 6

## 2015-03-17 MED ORDER — GLYCOPYRROLATE 0.2 MG/ML IJ SOLN
INTRAMUSCULAR | Status: DC | PRN
  Administered 2015-03-17: 0.2 mg via INTRAVENOUS

## 2015-03-17 MED ORDER — MIDAZOLAM HCL 5 MG/5ML IJ SOLN
INTRAMUSCULAR | Status: DC | PRN
  Administered 2015-03-17: 2 mg via INTRAVENOUS

## 2015-03-17 MED ORDER — ACETAMINOPHEN 10 MG/ML IV SOLN
INTRAVENOUS | Status: DC | PRN
  Administered 2015-03-17: 1000 mg via INTRAVENOUS

## 2015-03-17 MED ORDER — LACTATED RINGERS IV SOLN
INTRAVENOUS | Status: DC
Start: 2015-03-17 — End: 2015-03-17
  Filled 2015-03-17: qty 1000

## 2015-03-17 MED ORDER — LIDOCAINE HCL (CARDIAC) 20 MG/ML IV SOLN
INTRAVENOUS | Status: DC | PRN
  Administered 2015-03-17: 60 mg via INTRAVENOUS

## 2015-03-17 MED ORDER — VASOPRESSIN 20 UNIT/ML IV SOLN
INTRAVENOUS | Status: DC | PRN
  Administered 2015-03-17: 8 mL via INTRAMUSCULAR

## 2015-03-17 MED ORDER — FENTANYL CITRATE (PF) 100 MCG/2ML IJ SOLN
INTRAMUSCULAR | Status: DC | PRN
  Administered 2015-03-17 (×4): 50 ug via INTRAVENOUS

## 2015-03-17 MED ORDER — MIDAZOLAM HCL 2 MG/2ML IJ SOLN
INTRAMUSCULAR | Status: AC
  Filled 2015-03-17: qty 2

## 2015-03-17 MED ORDER — PROPOFOL 10 MG/ML IV BOLUS
INTRAVENOUS | Status: DC | PRN
  Administered 2015-03-17: 200 mg via INTRAVENOUS

## 2015-03-17 MED ORDER — FENTANYL CITRATE (PF) 100 MCG/2ML IJ SOLN
25.0000 ug | INTRAMUSCULAR | Status: DC | PRN
  Administered 2015-03-17: 25 ug via INTRAVENOUS
  Filled 2015-03-17: qty 1

## 2015-03-17 MED ORDER — OXYCODONE HCL 5 MG PO TABS
ORAL_TABLET | ORAL | Status: AC
  Filled 2015-03-17: qty 1

## 2015-03-17 MED ORDER — DEXAMETHASONE SODIUM PHOSPHATE 4 MG/ML IJ SOLN
INTRAMUSCULAR | Status: DC | PRN
  Administered 2015-03-17: 10 mg via INTRAVENOUS

## 2015-03-17 MED ORDER — OXYCODONE HCL 5 MG PO TABS
5.0000 mg | ORAL_TABLET | Freq: Once | ORAL | Status: AC
  Administered 2015-03-17: 5 mg via ORAL
  Filled 2015-03-17: qty 1

## 2015-03-17 MED ORDER — CEFAZOLIN SODIUM-DEXTROSE 2-3 GM-% IV SOLR
INTRAVENOUS | Status: AC
  Filled 2015-03-17: qty 50

## 2015-03-17 MED ORDER — FENTANYL CITRATE (PF) 100 MCG/2ML IJ SOLN
INTRAMUSCULAR | Status: AC
  Filled 2015-03-17: qty 2

## 2015-03-17 MED ORDER — ONDANSETRON HCL 4 MG/2ML IJ SOLN
INTRAMUSCULAR | Status: DC | PRN
Start: 2015-03-17 — End: 2015-03-17
  Administered 2015-03-17: 4 mg via INTRAVENOUS

## 2015-03-17 MED ORDER — SODIUM CHLORIDE 0.9 % IR SOLN
Status: DC | PRN
Start: 2015-03-17 — End: 2015-03-17
  Administered 2015-03-17: 3000 mL

## 2015-03-17 MED ORDER — MEPERIDINE HCL 25 MG/ML IJ SOLN
6.2500 mg | INTRAMUSCULAR | Status: DC | PRN
  Filled 2015-03-17: qty 1

## 2015-03-17 SURGICAL SUPPLY — 48 items
CANISTER SUCTION 2500CC (MISCELLANEOUS) ×4 IMPLANT
CANNULA CURETTE W/SYR 6 (CANNULA) IMPLANT
CANNULA CURETTE W/SYR 6MM (CANNULA)
CANNULA CURETTE W/SYR 7 (CANNULA) IMPLANT
CANNULA CURETTE W/SYR 7MM (CANNULA)
CATH HSG 5FRX28CM (CATHETERS) IMPLANT
CATH INTRA ACCESS BALLN (BALLOONS) IMPLANT
CATH ROBINSON RED A/P 16FR (CATHETERS) ×4 IMPLANT
CATH SALPINGOGRAPHY SELECT (BALLOONS) IMPLANT
CATH SSG INJECTION W/GUIDEWIRE (BALLOONS) IMPLANT
CORD ACTIVE DISPOSABLE (ELECTRODE)
CORD ELECTRO ACTIVE DISP (ELECTRODE) IMPLANT
COVER BACK TABLE 60X90IN (DRAPES) ×4 IMPLANT
DRAPE LG THREE QUARTER DISP (DRAPES) ×8 IMPLANT
ELECT LOOP GYNE PRO 24FR (CUTTING LOOP)
ELECT REM PT RETURN 9FT ADLT (ELECTROSURGICAL)
ELECT VAPORTRODE GRVD BAR (ELECTRODE) IMPLANT
ELECTRODE KNIFE SHAPED 22FR (ELECTROSURGICAL) IMPLANT
ELECTRODE LOOP GYNE PRO 24FR (CUTTING LOOP) IMPLANT
ELECTRODE REM PT RTRN 9FT ADLT (ELECTROSURGICAL) IMPLANT
GLOVE BIO SURGEON STRL SZ 6.5 (GLOVE) ×2 IMPLANT
GLOVE BIO SURGEON STRL SZ8 (GLOVE) ×4 IMPLANT
GLOVE BIO SURGEONS STRL SZ 6.5 (GLOVE) ×1
GLOVE BIOGEL PI IND STRL 8.5 (GLOVE) ×2 IMPLANT
GLOVE BIOGEL PI INDICATOR 8.5 (GLOVE) ×2
GLOVE INDICATOR 6.5 STRL GRN (GLOVE) ×4 IMPLANT
GLOVE INDICATOR 7.0 STRL GRN (GLOVE) ×3 IMPLANT
GOWN STRL REUS W/ TWL XL LVL3 (GOWN DISPOSABLE) ×4 IMPLANT
GOWN STRL REUS W/TWL LRG LVL3 (GOWN DISPOSABLE) ×4 IMPLANT
GOWN STRL REUS W/TWL XL LVL3 (GOWN DISPOSABLE) ×8
LEGGING LITHOTOMY PAIR STRL (DRAPES) ×4 IMPLANT
LOOP ANGLED CUTTING 22FR (CUTTING LOOP) IMPLANT
MANIFOLD NEPTUNE II (INSTRUMENTS) IMPLANT
NDL SPNL 22GX3.5 QUINCKE BK (NEEDLE) ×1 IMPLANT
NEEDLE SPNL 22GX3.5 QUINCKE BK (NEEDLE) ×4 IMPLANT
PACK BASIN DAY SURGERY FS (CUSTOM PROCEDURE TRAY) ×4 IMPLANT
PAD OB MATERNITY 4.3X12.25 (PERSONAL CARE ITEMS) ×4 IMPLANT
SET IRRIG Y TYPE TUR BLADDER L (SET/KITS/TRAYS/PACK) IMPLANT
STENT BALLN UTERINE 4CM 6FR (STENTS) IMPLANT
SUT SILK 2 0 SH (SUTURE) IMPLANT
SUT SILK 3 0 PS 1 (SUTURE) IMPLANT
SYR 20CC LL (SYRINGE) IMPLANT
SYR 3ML 18GX1 1/2 (SYRINGE) ×4 IMPLANT
TOWEL OR 17X24 6PK STRL BLUE (TOWEL DISPOSABLE) ×8 IMPLANT
TRAY DSU PREP LF (CUSTOM PROCEDURE TRAY) ×4 IMPLANT
TUBE CONNECTING 12'X1/4 (SUCTIONS) ×1
TUBE CONNECTING 12X1/4 (SUCTIONS) ×3 IMPLANT
WATER STERILE IRR 500ML POUR (IV SOLUTION) ×4 IMPLANT

## 2015-03-17 NOTE — H&P (Signed)
History and Physical Interval Note:  03/17/2015 5:40 PM  Kristine Cummings  has presented today for surgery, with the diagnosis of endometrial polyp  The various methods of treatment have been discussed with the patient and family. After consideration of risks, benefits and other options for treatment, the patient has consented to  Procedure(s): HYSTEROSCOPY AND ENDOMETRIUM BIOPSY (N/A) as a surgical intervention .  The patient's history has been reviewed, patient examined, no change in status, stable for surgery.  I have reviewed the patient's chart and labs.  Questions were answered to the patient's satisfaction.     Fermin Schwab

## 2015-03-17 NOTE — Transfer of Care (Signed)
Last Vitals:  Filed Vitals:   03/17/15 1156  BP: 109/61  Pulse: 74  Temp: 36.5 C  Resp: 16  Immediate Anesthesia Transfer of Care Note  Patient: Kristine Cummings  Procedure(s) Performed: Procedure(s) (LRB): HYSTEROSCOPY AND ENDOMETRIUM BIOPSY (N/A)  Patient Location: PACU  Anesthesia Type: General  Level of Consciousness: awake, alert  and oriented  Airway & Oxygen Therapy: Patient Spontanous Breathing and Patient connected to face mask oxygen  Post-op Assessment: Report given to PACU RN and Post -op Vital signs reviewed and stable  Post vital signs: Reviewed and stable  Complications: No apparent anesthesia complications

## 2015-03-17 NOTE — Anesthesia Preprocedure Evaluation (Signed)
Anesthesia Evaluation  Patient identified by MRN, date of birth, ID band Patient awake    Reviewed: Allergy & Precautions, NPO status , Patient's Chart, lab work & pertinent test results  Airway Mallampati: II  TM Distance: >3 FB Neck ROM: Full    Dental no notable dental hx.    Pulmonary neg pulmonary ROS, Current Smoker,  breath sounds clear to auscultation  Pulmonary exam normal       Cardiovascular negative cardio ROS Normal cardiovascular examRhythm:Regular Rate:Normal     Neuro/Psych negative neurological ROS  negative psych ROS   GI/Hepatic negative GI ROS, Neg liver ROS,   Endo/Other  negative endocrine ROS  Renal/GU negative Renal ROS  negative genitourinary   Musculoskeletal negative musculoskeletal ROS (+)   Abdominal   Peds negative pediatric ROS (+)  Hematology negative hematology ROS (+)   Anesthesia Other Findings   Reproductive/Obstetrics negative OB ROS                            Anesthesia Physical Anesthesia Plan  ASA: II  Anesthesia Plan: General   Post-op Pain Management:    Induction: Intravenous  Airway Management Planned: LMA  Additional Equipment:   Intra-op Plan:   Post-operative Plan: Extubation in OR  Informed Consent: I have reviewed the patients History and Physical, chart, labs and discussed the procedure including the risks, benefits and alternatives for the proposed anesthesia with the patient or authorized representative who has indicated his/her understanding and acceptance.   Dental advisory given  Plan Discussed with: CRNA  Anesthesia Plan Comments:         Anesthesia Quick Evaluation  

## 2015-03-17 NOTE — Anesthesia Postprocedure Evaluation (Signed)
  Anesthesia Post-op Note  Patient: Kristine Cummings  Procedure(s) Performed: Procedure(s) (LRB): HYSTEROSCOPY AND ENDOMETRIUM BIOPSY (N/A)  Patient Location: PACU  Anesthesia Type: General  Level of Consciousness: awake and alert   Airway and Oxygen Therapy: Patient Spontanous Breathing  Post-op Pain: mild  Post-op Assessment: Post-op Vital signs reviewed, Patient's Cardiovascular Status Stable, Respiratory Function Stable, Patent Airway and No signs of Nausea or vomiting  Last Vitals:  Filed Vitals:   03/17/15 1745  BP: 118/75  Pulse: 72  Temp:   Resp: 18    Post-op Vital Signs: stable   Complications: No apparent anesthesia complications

## 2015-03-17 NOTE — Anesthesia Procedure Notes (Signed)
Procedure Name: LMA Insertion Date/Time: 03/17/2015 4:53 PM Performed by: Norva Pavlov Pre-anesthesia Checklist: Patient identified, Emergency Drugs available, Suction available and Patient being monitored Patient Re-evaluated:Patient Re-evaluated prior to inductionOxygen Delivery Method: Circle System Utilized Preoxygenation: Pre-oxygenation with 100% oxygen Intubation Type: IV induction Ventilation: Mask ventilation without difficulty LMA: LMA inserted LMA Size: 4.0 Number of attempts: 1 Airway Equipment and Method: bite block Placement Confirmation: positive ETCO2 Tube secured with: Tape Dental Injury: Teeth and Oropharynx as per pre-operative assessment

## 2015-03-17 NOTE — Discharge Instructions (Addendum)

## 2015-03-17 NOTE — Op Note (Signed)
OPERATIVE NOTE  Preoperative diagnosis: Endometrial polyp  Postoperative diagnosis: Normal endometrium, rt tubal polyp  Procedure: Hysteroscopy, removal of right tubal ostial polyp, endometrial biopsy  Surgeon: Fermin Schwab  Anesthesia: General  Complications: None  Estimated blood loss: Less than 20 mL  Specimen: Endometrial biopsy and right tubal polyp to pathology  Findings: On examination under anesthesia, vagina and cervix were normal. The uterus sounded to 8 cm. Uterine corpus was normal size, retroverted, there were no adnexal masses. There was posterior cul-de-sac induration and nodularity at the point of insertion of left uterosacral ligament to the uterus.  Endocervical canal appeared normal. The uterus sounded to 8 cm. Endometrial cavity had no polyps. Otherwise it was of normal appearance and normal configuration. Both tubal ostia were seen. There was a 3 x 3 mm right tubal ostial polyp 2, and these were removed.  Description of procedure: Patient was placed in dorsal supine position. General anesthesia was administered. She was placed in lithotomy position. She was prepped and draped in sterile manner. A vaginal speculum was placed. A dilute vasopressin solution containing 0.33 units per milliliter was injected into the cervical stroma x5 cc. A Slimline hysteroscope with 30 lens was inserted into the canal and above findings were noted. Distention medium was normal saline. Distention method was gravity. Above findings were noted.  The cluster of two right tubal ostial polyps were grasped with hysteroscopic graspers and removed and submitted to pathology. Using a sharp curet endometrial biopsy was obtained and the specimen was sent to pathology.  Hemostasis was insured. Instrument count was correct. Estimated blood loss was less than 20 mL. The patient tolerated the procedure well and was transferred to recovery in satisfactory condition.  Fermin Schwab

## 2015-03-20 ENCOUNTER — Encounter (HOSPITAL_BASED_OUTPATIENT_CLINIC_OR_DEPARTMENT_OTHER): Payer: Self-pay | Admitting: Obstetrics and Gynecology

## 2015-03-23 LAB — POCT HEMOGLOBIN-HEMACUE: Hemoglobin: 13.6 g/dL (ref 12.0–15.0)

## 2015-12-04 ENCOUNTER — Other Ambulatory Visit: Payer: Self-pay | Admitting: Obstetrics and Gynecology

## 2015-12-04 DIAGNOSIS — N6459 Other signs and symptoms in breast: Secondary | ICD-10-CM

## 2015-12-22 ENCOUNTER — Ambulatory Visit
Admission: RE | Admit: 2015-12-22 | Discharge: 2015-12-22 | Disposition: A | Discharge status: Home / Self Care | Payer: BLUE CROSS/BLUE SHIELD | Source: Ambulatory Visit | Attending: Obstetrics and Gynecology | Admitting: Obstetrics and Gynecology

## 2015-12-22 DIAGNOSIS — N6459 Other signs and symptoms in breast: Secondary | ICD-10-CM

## 2016-01-01 DIAGNOSIS — F411 Generalized anxiety disorder: Secondary | ICD-10-CM | POA: Diagnosis not present

## 2016-01-01 DIAGNOSIS — F430 Acute stress reaction: Secondary | ICD-10-CM | POA: Diagnosis not present

## 2016-01-15 DIAGNOSIS — F430 Acute stress reaction: Secondary | ICD-10-CM | POA: Diagnosis not present

## 2016-01-15 DIAGNOSIS — F411 Generalized anxiety disorder: Secondary | ICD-10-CM | POA: Diagnosis not present

## 2016-01-30 DIAGNOSIS — F430 Acute stress reaction: Secondary | ICD-10-CM | POA: Diagnosis not present

## 2016-01-30 DIAGNOSIS — F411 Generalized anxiety disorder: Secondary | ICD-10-CM | POA: Diagnosis not present

## 2016-02-12 DIAGNOSIS — F430 Acute stress reaction: Secondary | ICD-10-CM | POA: Diagnosis not present

## 2016-02-12 DIAGNOSIS — F411 Generalized anxiety disorder: Secondary | ICD-10-CM | POA: Diagnosis not present

## 2016-02-28 DIAGNOSIS — N938 Other specified abnormal uterine and vaginal bleeding: Secondary | ICD-10-CM | POA: Diagnosis not present

## 2016-02-28 DIAGNOSIS — N900 Mild vulvar dysplasia: Secondary | ICD-10-CM | POA: Diagnosis not present

## 2016-02-28 DIAGNOSIS — R938 Abnormal findings on diagnostic imaging of other specified body structures: Secondary | ICD-10-CM | POA: Diagnosis not present

## 2016-03-06 DIAGNOSIS — N939 Abnormal uterine and vaginal bleeding, unspecified: Secondary | ICD-10-CM | POA: Diagnosis not present

## 2016-03-06 DIAGNOSIS — R938 Abnormal findings on diagnostic imaging of other specified body structures: Secondary | ICD-10-CM | POA: Diagnosis not present

## 2016-03-12 DIAGNOSIS — F411 Generalized anxiety disorder: Secondary | ICD-10-CM | POA: Diagnosis not present

## 2016-03-12 DIAGNOSIS — F430 Acute stress reaction: Secondary | ICD-10-CM | POA: Diagnosis not present

## 2016-03-26 DIAGNOSIS — F430 Acute stress reaction: Secondary | ICD-10-CM | POA: Diagnosis not present

## 2016-03-26 DIAGNOSIS — F411 Generalized anxiety disorder: Secondary | ICD-10-CM | POA: Diagnosis not present

## 2016-03-28 DIAGNOSIS — N809 Endometriosis, unspecified: Secondary | ICD-10-CM | POA: Diagnosis not present

## 2016-03-28 DIAGNOSIS — E559 Vitamin D deficiency, unspecified: Secondary | ICD-10-CM | POA: Diagnosis not present

## 2016-05-03 DIAGNOSIS — N809 Endometriosis, unspecified: Secondary | ICD-10-CM | POA: Diagnosis not present

## 2016-05-08 DIAGNOSIS — F430 Acute stress reaction: Secondary | ICD-10-CM | POA: Diagnosis not present

## 2016-05-08 DIAGNOSIS — F411 Generalized anxiety disorder: Secondary | ICD-10-CM | POA: Diagnosis not present

## 2016-05-22 DIAGNOSIS — F411 Generalized anxiety disorder: Secondary | ICD-10-CM | POA: Diagnosis not present

## 2016-05-22 DIAGNOSIS — F430 Acute stress reaction: Secondary | ICD-10-CM | POA: Diagnosis not present

## 2016-06-03 DIAGNOSIS — F430 Acute stress reaction: Secondary | ICD-10-CM | POA: Diagnosis not present

## 2016-06-03 DIAGNOSIS — F411 Generalized anxiety disorder: Secondary | ICD-10-CM | POA: Diagnosis not present

## 2016-06-03 DIAGNOSIS — N803 Endometriosis of pelvic peritoneum: Secondary | ICD-10-CM | POA: Diagnosis not present

## 2016-06-19 DIAGNOSIS — F430 Acute stress reaction: Secondary | ICD-10-CM | POA: Diagnosis not present

## 2016-06-19 DIAGNOSIS — F411 Generalized anxiety disorder: Secondary | ICD-10-CM | POA: Diagnosis not present

## 2016-06-26 DIAGNOSIS — F411 Generalized anxiety disorder: Secondary | ICD-10-CM | POA: Diagnosis not present

## 2016-06-26 DIAGNOSIS — F430 Acute stress reaction: Secondary | ICD-10-CM | POA: Diagnosis not present

## 2016-07-31 DIAGNOSIS — T7840XA Allergy, unspecified, initial encounter: Secondary | ICD-10-CM | POA: Diagnosis not present

## 2016-08-15 DIAGNOSIS — R05 Cough: Secondary | ICD-10-CM | POA: Diagnosis not present

## 2016-08-15 DIAGNOSIS — J019 Acute sinusitis, unspecified: Secondary | ICD-10-CM | POA: Diagnosis not present

## 2016-08-21 DIAGNOSIS — F430 Acute stress reaction: Secondary | ICD-10-CM | POA: Diagnosis not present

## 2016-08-21 DIAGNOSIS — F411 Generalized anxiety disorder: Secondary | ICD-10-CM | POA: Diagnosis not present

## 2016-08-30 DIAGNOSIS — N803 Endometriosis of pelvic peritoneum: Secondary | ICD-10-CM | POA: Diagnosis not present

## 2016-09-20 DIAGNOSIS — L282 Other prurigo: Secondary | ICD-10-CM | POA: Diagnosis not present

## 2016-09-25 DIAGNOSIS — F430 Acute stress reaction: Secondary | ICD-10-CM | POA: Diagnosis not present

## 2016-09-25 DIAGNOSIS — F411 Generalized anxiety disorder: Secondary | ICD-10-CM | POA: Diagnosis not present

## 2016-10-08 DIAGNOSIS — F430 Acute stress reaction: Secondary | ICD-10-CM | POA: Diagnosis not present

## 2016-10-08 DIAGNOSIS — F411 Generalized anxiety disorder: Secondary | ICD-10-CM | POA: Diagnosis not present

## 2016-10-23 DIAGNOSIS — F411 Generalized anxiety disorder: Secondary | ICD-10-CM | POA: Diagnosis not present

## 2016-10-23 DIAGNOSIS — F430 Acute stress reaction: Secondary | ICD-10-CM | POA: Diagnosis not present

## 2016-11-06 DIAGNOSIS — F430 Acute stress reaction: Secondary | ICD-10-CM | POA: Diagnosis not present

## 2016-11-06 DIAGNOSIS — F411 Generalized anxiety disorder: Secondary | ICD-10-CM | POA: Diagnosis not present

## 2016-11-20 DIAGNOSIS — F430 Acute stress reaction: Secondary | ICD-10-CM | POA: Diagnosis not present

## 2016-11-20 DIAGNOSIS — F411 Generalized anxiety disorder: Secondary | ICD-10-CM | POA: Diagnosis not present

## 2016-12-04 DIAGNOSIS — F430 Acute stress reaction: Secondary | ICD-10-CM | POA: Diagnosis not present

## 2016-12-04 DIAGNOSIS — F411 Generalized anxiety disorder: Secondary | ICD-10-CM | POA: Diagnosis not present

## 2016-12-10 DIAGNOSIS — Z319 Encounter for procreative management, unspecified: Secondary | ICD-10-CM | POA: Diagnosis not present

## 2016-12-10 DIAGNOSIS — N809 Endometriosis, unspecified: Secondary | ICD-10-CM | POA: Diagnosis not present

## 2016-12-18 DIAGNOSIS — F411 Generalized anxiety disorder: Secondary | ICD-10-CM | POA: Diagnosis not present

## 2016-12-18 DIAGNOSIS — F430 Acute stress reaction: Secondary | ICD-10-CM | POA: Diagnosis not present

## 2016-12-30 DIAGNOSIS — F411 Generalized anxiety disorder: Secondary | ICD-10-CM | POA: Diagnosis not present

## 2016-12-30 DIAGNOSIS — F430 Acute stress reaction: Secondary | ICD-10-CM | POA: Diagnosis not present

## 2017-01-01 DIAGNOSIS — Z0000 Encounter for general adult medical examination without abnormal findings: Secondary | ICD-10-CM | POA: Diagnosis not present

## 2017-01-01 DIAGNOSIS — Z1151 Encounter for screening for human papillomavirus (HPV): Secondary | ICD-10-CM | POA: Diagnosis not present

## 2017-01-01 DIAGNOSIS — Z1159 Encounter for screening for other viral diseases: Secondary | ICD-10-CM | POA: Diagnosis not present

## 2017-01-01 DIAGNOSIS — R232 Flushing: Secondary | ICD-10-CM | POA: Diagnosis not present

## 2017-01-01 DIAGNOSIS — Z131 Encounter for screening for diabetes mellitus: Secondary | ICD-10-CM | POA: Diagnosis not present

## 2017-01-01 DIAGNOSIS — Z1329 Encounter for screening for other suspected endocrine disorder: Secondary | ICD-10-CM | POA: Diagnosis not present

## 2017-01-01 DIAGNOSIS — Z130 Encounter for screening for diseases of the blood and blood-forming organs and certain disorders involving the immune mechanism: Secondary | ICD-10-CM | POA: Diagnosis not present

## 2017-01-01 DIAGNOSIS — Z01419 Encounter for gynecological examination (general) (routine) without abnormal findings: Secondary | ICD-10-CM | POA: Diagnosis not present

## 2017-01-01 DIAGNOSIS — Z6825 Body mass index (BMI) 25.0-25.9, adult: Secondary | ICD-10-CM | POA: Diagnosis not present

## 2017-01-01 DIAGNOSIS — Z113 Encounter for screening for infections with a predominantly sexual mode of transmission: Secondary | ICD-10-CM | POA: Diagnosis not present

## 2017-01-01 DIAGNOSIS — Z114 Encounter for screening for human immunodeficiency virus [HIV]: Secondary | ICD-10-CM | POA: Diagnosis not present

## 2017-01-15 DIAGNOSIS — F411 Generalized anxiety disorder: Secondary | ICD-10-CM | POA: Diagnosis not present

## 2017-01-15 DIAGNOSIS — F430 Acute stress reaction: Secondary | ICD-10-CM | POA: Diagnosis not present

## 2017-02-12 DIAGNOSIS — F430 Acute stress reaction: Secondary | ICD-10-CM | POA: Diagnosis not present

## 2017-02-12 DIAGNOSIS — F411 Generalized anxiety disorder: Secondary | ICD-10-CM | POA: Diagnosis not present

## 2017-03-12 DIAGNOSIS — F430 Acute stress reaction: Secondary | ICD-10-CM | POA: Diagnosis not present

## 2017-03-12 DIAGNOSIS — F411 Generalized anxiety disorder: Secondary | ICD-10-CM | POA: Diagnosis not present

## 2017-05-14 DIAGNOSIS — F430 Acute stress reaction: Secondary | ICD-10-CM | POA: Diagnosis not present

## 2017-05-14 DIAGNOSIS — F411 Generalized anxiety disorder: Secondary | ICD-10-CM | POA: Diagnosis not present

## 2017-07-29 DIAGNOSIS — F430 Acute stress reaction: Secondary | ICD-10-CM | POA: Diagnosis not present

## 2017-07-29 DIAGNOSIS — F411 Generalized anxiety disorder: Secondary | ICD-10-CM | POA: Diagnosis not present

## 2017-08-12 DIAGNOSIS — F430 Acute stress reaction: Secondary | ICD-10-CM | POA: Diagnosis not present

## 2017-08-12 DIAGNOSIS — F411 Generalized anxiety disorder: Secondary | ICD-10-CM | POA: Diagnosis not present

## 2017-08-26 ENCOUNTER — Encounter (HOSPITAL_COMMUNITY): Payer: Self-pay | Admitting: *Deleted

## 2017-08-26 ENCOUNTER — Other Ambulatory Visit: Payer: Self-pay

## 2017-08-26 ENCOUNTER — Ambulatory Visit (HOSPITAL_COMMUNITY)
Admission: EM | Admit: 2017-08-26 | Discharge: 2017-08-26 | Disposition: A | Discharge status: Home / Self Care | Payer: BLUE CROSS/BLUE SHIELD | Attending: Family Medicine | Admitting: Family Medicine

## 2017-08-26 DIAGNOSIS — L509 Urticaria, unspecified: Secondary | ICD-10-CM | POA: Diagnosis not present

## 2017-08-26 MED ORDER — METHYLPREDNISOLONE SODIUM SUCC 125 MG IJ SOLR
125.0000 mg | Freq: Once | INTRAMUSCULAR | Status: AC
  Administered 2017-08-26: 125 mg via INTRAMUSCULAR

## 2017-08-26 MED ORDER — PREDNISONE 10 MG (48) PO TBPK: ORAL_TABLET | ORAL | 0 refills | Status: DC

## 2017-08-26 MED ORDER — METHYLPREDNISOLONE SODIUM SUCC 125 MG IJ SOLR
INTRAMUSCULAR | Status: AC
  Filled 2017-08-26: qty 2

## 2017-08-26 NOTE — ED Triage Notes (Signed)
Per pt she thinks she is having allergic reaction, per pt she has rash all over her body,

## 2017-08-27 DIAGNOSIS — F411 Generalized anxiety disorder: Secondary | ICD-10-CM | POA: Diagnosis not present

## 2017-08-27 DIAGNOSIS — F430 Acute stress reaction: Secondary | ICD-10-CM | POA: Diagnosis not present

## 2017-08-30 NOTE — ED Provider Notes (Signed)
  [facility]   [id number] 08/26/17 Arrival Time: 1810  ASSESSMENT & PLAN:  1. Hives     Meds ordered this encounter  Medications  . methylPREDNISolone sodium succinate (SOLU-MEDROL) 125 mg/2 mL injection 125 mg  . predniSONE (STERAPRED UNI-PAK 48 TAB) 10 MG (48) TBPK tablet    Sig: Take as directed.    Dispense:  48 tablet    Refill:  0   Continue Benadryl if needed. Will f/u if not seeing improvement over the next 24-48 hours.  Reviewed expectations re: course of current medical issues. Questions answered. Outlined signs and symptoms indicating need for more acute intervention. Patient verbalized understanding. After Visit Summary given.   SUBJECTIVE:  Kristine Cummings is a 37 y.o. female who presents with complaint of a "red rash all over my body." Abrupt onset today. No trigger identified. No new exposures. No associated pain, nausea, vomiting, or breathing problems. No new medications. Benadryl without help. Much itching. No associated pain. No specific aggravating or alleviating factors reported. No h/o similar.   ROS: As per HPI.  OBJECTIVE: Vitals:   08/26/17 1818  BP: 99/70  Pulse: 78  Temp: 97.8 F (36.6 C)  TempSrc: Oral  SpO2: 100%    General appearance: alert; no distress Lungs: clear to auscultation bilaterally Heart: regular rate and rhythm Extremities: no edema Skin: warm and dry; wheals over body that spare face and neck Psychological: alert and cooperative; normal mood and affect   No Known Allergies  Past Medical History:  Diagnosis Date  . Endometrial polyp   . Endometriosis   . GERD (gastroesophageal reflux disease)    uses otc nexium or prilosec if needed  . History of cervical dysplasia    CIN I  in 2005-2006  . Wears contact lenses    Social History   Socioeconomic History  . Marital status: Single    Spouse name: Not on file  . Number of children: Not on file  . Years of education: Not on file  . Highest education  level: Not on file  Social Needs  . Financial resource strain: Not on file  . Food insecurity - worry: Not on file  . Food insecurity - inability: Not on file  . Transportation needs - medical: Not on file  . Transportation needs - non-medical: Not on file  Occupational History  . Not on file  Tobacco Use  . Smoking status: Current Every Day Smoker    Packs/day: 0.00    Years: 15.00    Pack years: 0.00    Types: Cigars  . Smokeless tobacco: Never Used  . Tobacco comment: currently smokes 2 cigars daily/  quit 1ppd cigarettes june 2015  Substance and Sexual Activity  . Alcohol use: Yes    Comment: occasional  . Drug use: No  . Sexual activity: Not on file  Other Topics Concern  . Not on file  Social History Narrative  . Not on file   History reviewed. No pertinent family history. Past Surgical History:  Procedure Laterality Date  . EGG RETRIEVAL W/ PROPOFOL  march 2016  . HYSTEROSCOPY N/A 03/17/2015   Procedure: HYSTEROSCOPY AND ENDOMETRIUM BIOPSY;  Surgeon: Fermin Schwabamer Yalcinkaya, MD;  [facility] [place];  Service: Gynecology;  Laterality: N/A;  . ROBOTIC ASSISTED LAPAROSCOPY / RESECTION ENDOMETRIOSIS/  LYSIS CECAL AND APPENDICEAL ADHESIONS  06-18-2011  . WISDOM TOOTH EXTRACTION  as teen     Mardella LaymanHagler, Braylynn Ghan, MD 08/30/17 1102

## 2017-09-10 DIAGNOSIS — F430 Acute stress reaction: Secondary | ICD-10-CM | POA: Diagnosis not present

## 2017-09-10 DIAGNOSIS — F411 Generalized anxiety disorder: Secondary | ICD-10-CM | POA: Diagnosis not present

## 2017-09-24 DIAGNOSIS — F430 Acute stress reaction: Secondary | ICD-10-CM | POA: Diagnosis not present

## 2017-09-24 DIAGNOSIS — F411 Generalized anxiety disorder: Secondary | ICD-10-CM | POA: Diagnosis not present

## 2017-10-07 DIAGNOSIS — F411 Generalized anxiety disorder: Secondary | ICD-10-CM | POA: Diagnosis not present

## 2017-10-07 DIAGNOSIS — F430 Acute stress reaction: Secondary | ICD-10-CM | POA: Diagnosis not present

## 2017-11-04 DIAGNOSIS — F430 Acute stress reaction: Secondary | ICD-10-CM | POA: Diagnosis not present

## 2017-11-04 DIAGNOSIS — F411 Generalized anxiety disorder: Secondary | ICD-10-CM | POA: Diagnosis not present

## 2017-11-18 DIAGNOSIS — F411 Generalized anxiety disorder: Secondary | ICD-10-CM | POA: Diagnosis not present

## 2017-11-18 DIAGNOSIS — F430 Acute stress reaction: Secondary | ICD-10-CM | POA: Diagnosis not present

## 2017-12-02 DIAGNOSIS — F411 Generalized anxiety disorder: Secondary | ICD-10-CM | POA: Diagnosis not present

## 2017-12-02 DIAGNOSIS — F430 Acute stress reaction: Secondary | ICD-10-CM | POA: Diagnosis not present

## 2017-12-28 IMAGING — MG MM DIAG BREAST TOMO BILATERAL
8 of 14 series · 8 of 30 positions shown · non-contrast
Comparison: Previous exam(s).

CLINICAL DATA: Patient presents with 2 palpable lumps, wound in the
upper outer left breast a and the other in the lower breast.

EXAM:
2D DIGITAL DIAGNOSTIC BILATERAL MAMMOGRAM WITH CAD AND ADJUNCT TOMO
ULTRASOUND LEFT BREAST

[L TAN (1 of 2)]
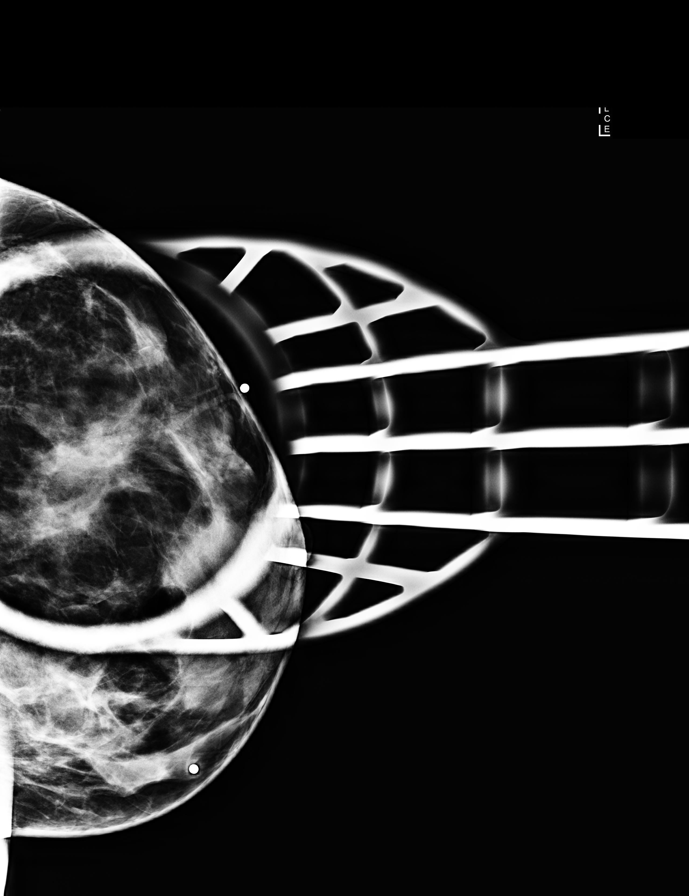

[L TAN (2 of 2)]
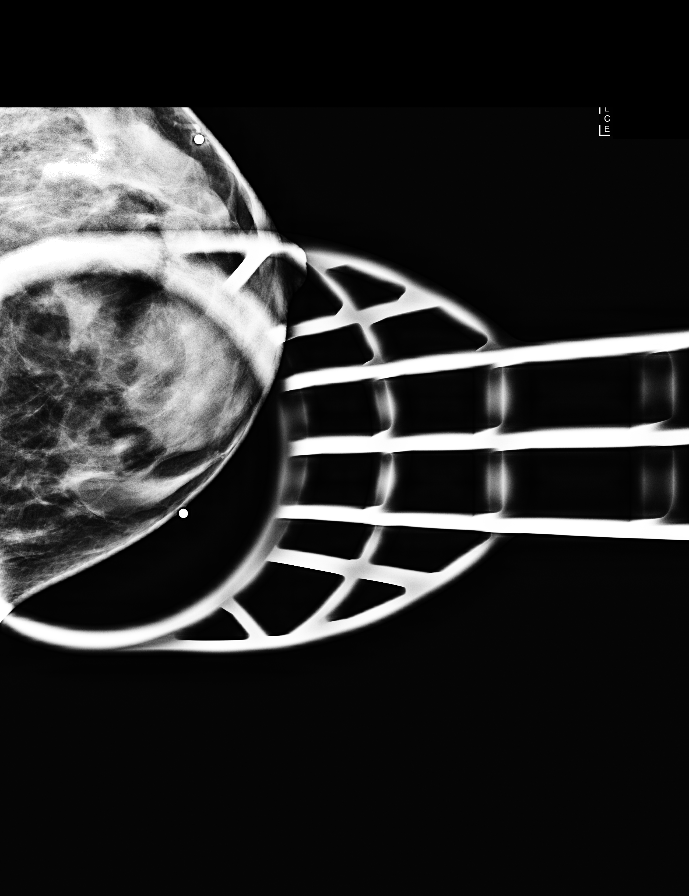

[R CC synth-2D]
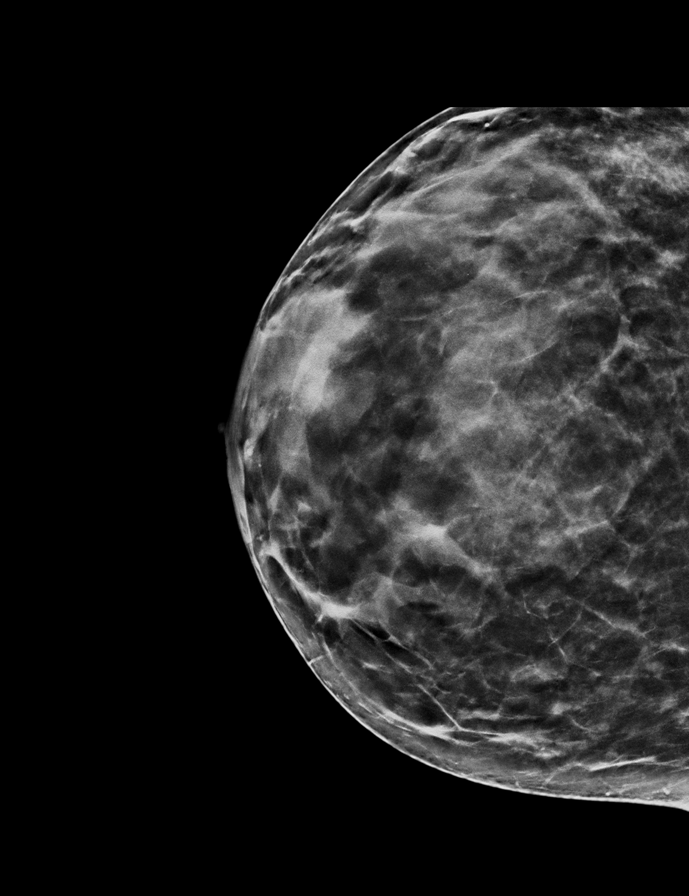

[R MLO synth-2D]
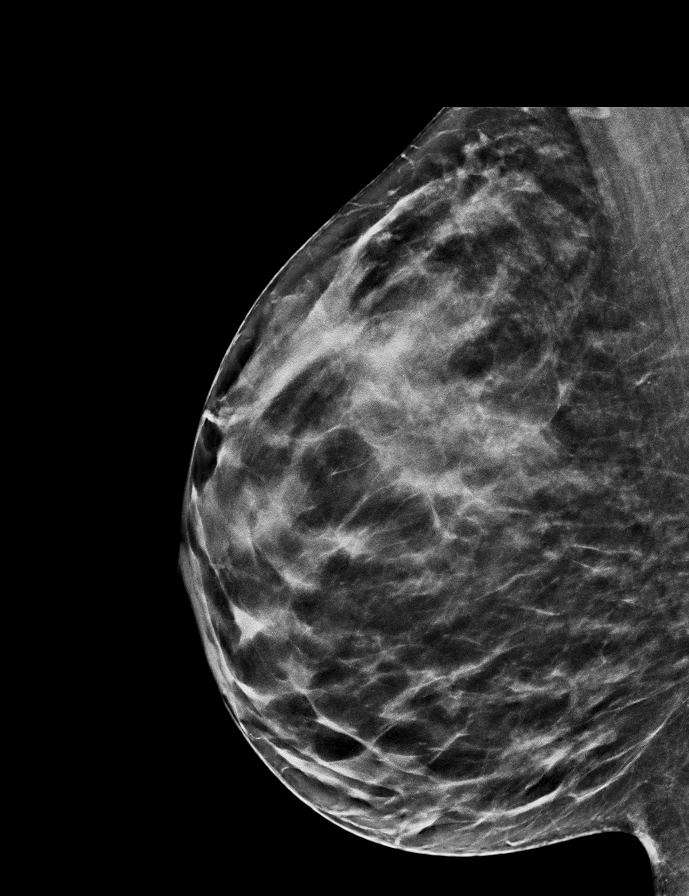

[L CC synth-2D]
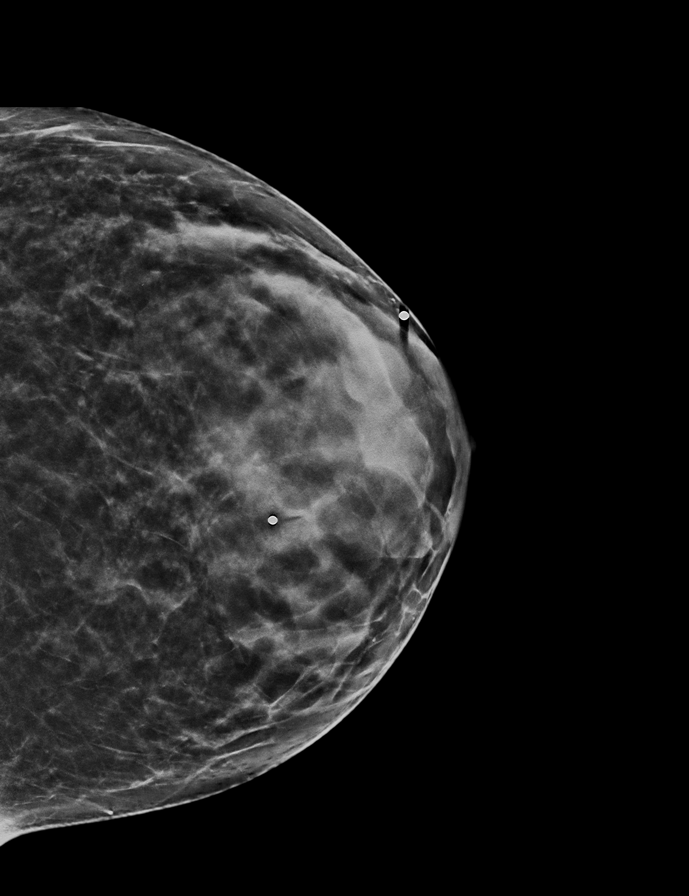

[L MLO synth-2D]
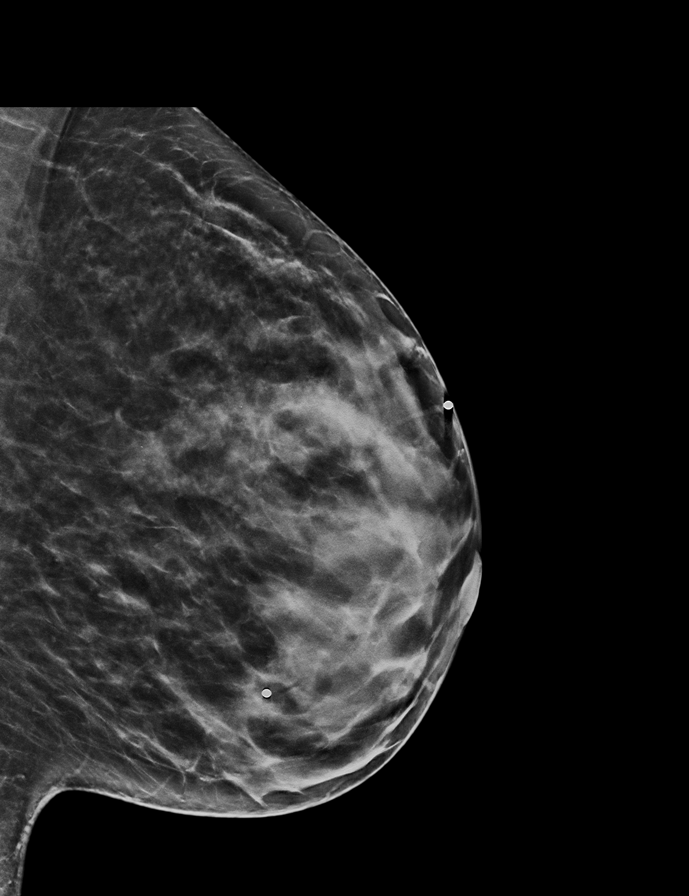

[L CC]
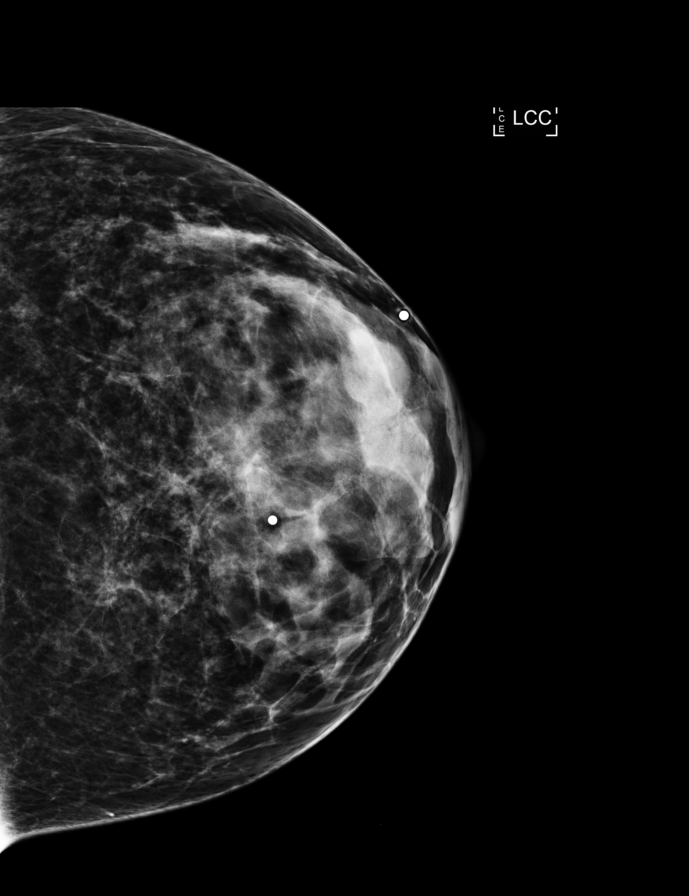

[R CC]
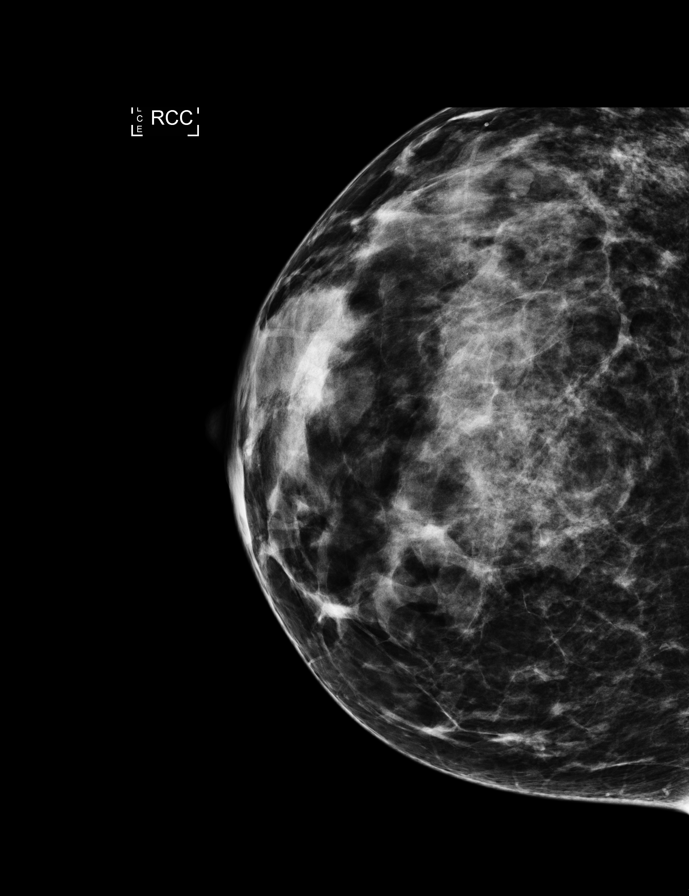

[8 of 30 positions shown; findings below may reference images not displayed]

ACR Breast Density Category c: The breast tissue is heterogeneously
dense, which may obscure small masses.
FINDINGS: There is a round partly circumscribed mass in the upper outer left
breast corresponding to the palpable abnormality. This lies anterior
[DATE] near the nipple.

There are no other discrete masses. There is no architectural
distortion. There are no suspicious calcifications.

Mammographic images were processed with CAD.

On physical exam, there is a mobile smooth mass just above the
nipple. No other palpable masses.

Targeted ultrasound is performed, showing a septated cyst in the 12
o'clock position, 1 cm from the nipple, measuring 19 x 14 x 16 mm.
In the 5 o'clock position, 3 cm the nipple, there is a small oval
cyst measuring 6 x 5 x 7 mm. There are several other subcentimeter
cysts in the upper outer and lateral left breast. There are no solid
masses or suspicious lesions.
IMPRESSION: 1. Benign left breast cysts.  No evidence of malignancy.

RECOMMENDATION:
Screening mammogram at age 38 unless there are persistent or
intervening clinical concerns. (Code:6C-8-ZYV)

I have discussed the findings and recommendations with the patient.
Results were also provided in writing at the conclusion of the
visit. If applicable, a reminder letter will be sent to the patient
regarding the next appointment.

BI-RADS CATEGORY  2: Benign.

## 2017-12-28 IMAGING — US US BREAST 10 MINUTES
1 series · 8 of 8 positions shown · non-contrast
Comparison: Previous exam(s).

CLINICAL DATA: Patient presents with 2 palpable lumps, wound in the
upper outer left breast a and the other in the lower breast.

EXAM:
2D DIGITAL DIAGNOSTIC BILATERAL MAMMOGRAM WITH CAD AND ADJUNCT TOMO
ULTRASOUND LEFT BREAST

[Series 1: us breast 10 minutes · 0.06mm/px · 8 of 8 slices shown]
[im 1/8]
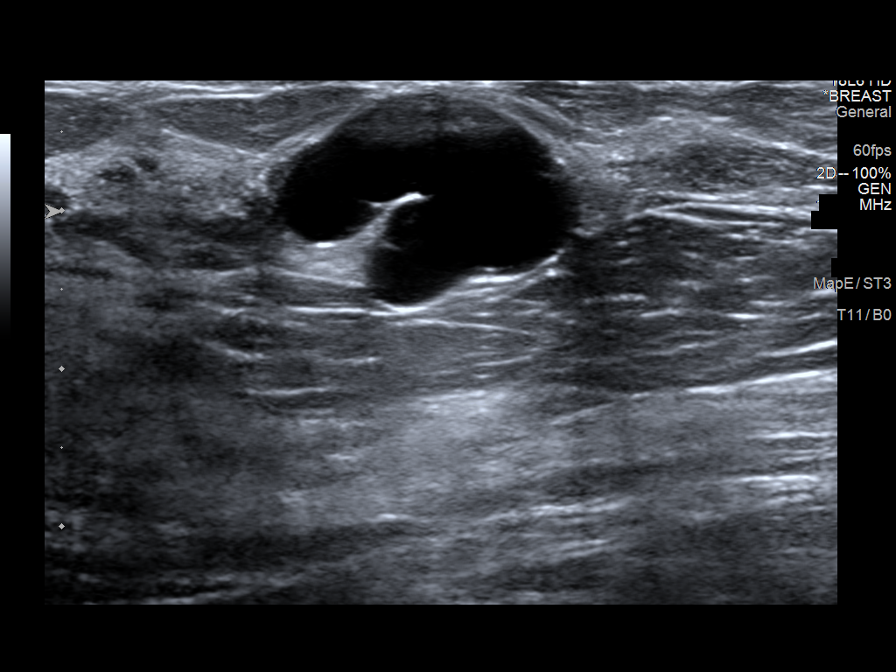
[im 2/8]
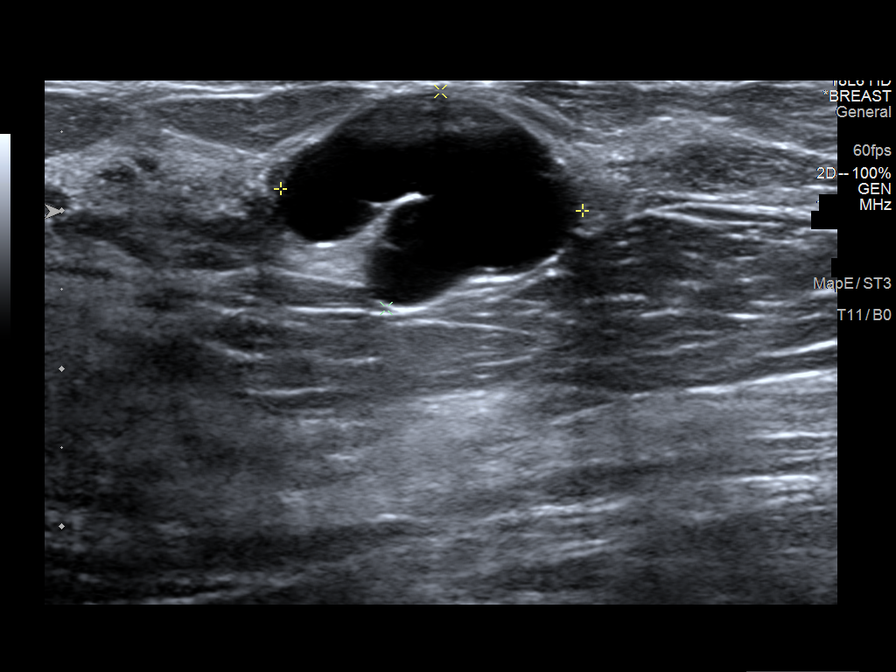
[im 3/8]
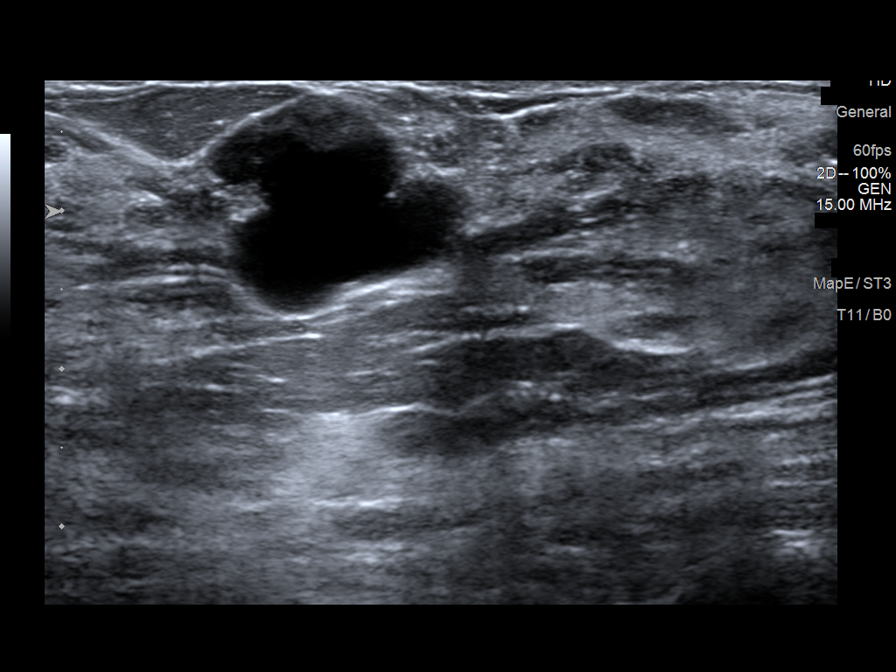
[im 4/8]
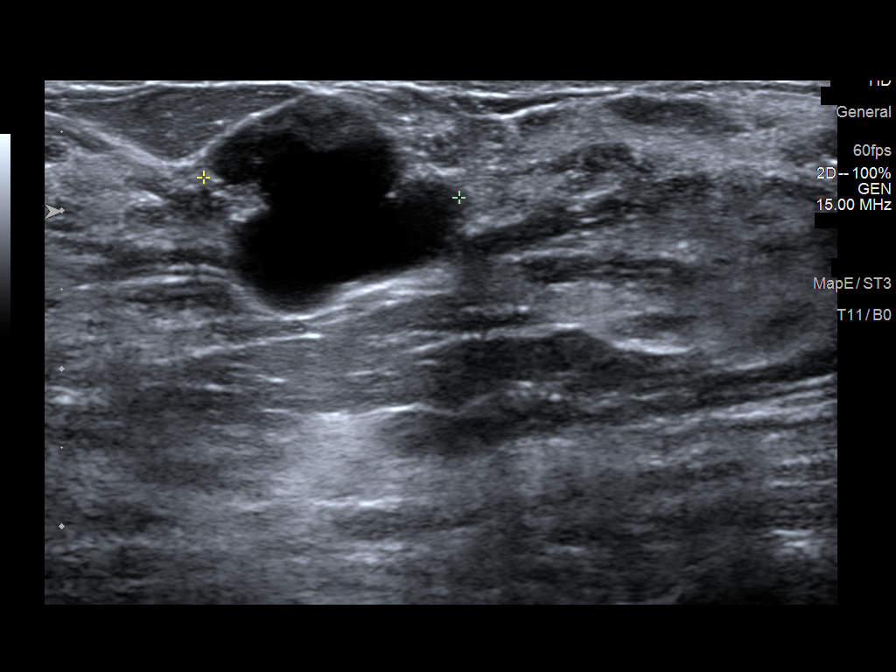
[im 5/8]
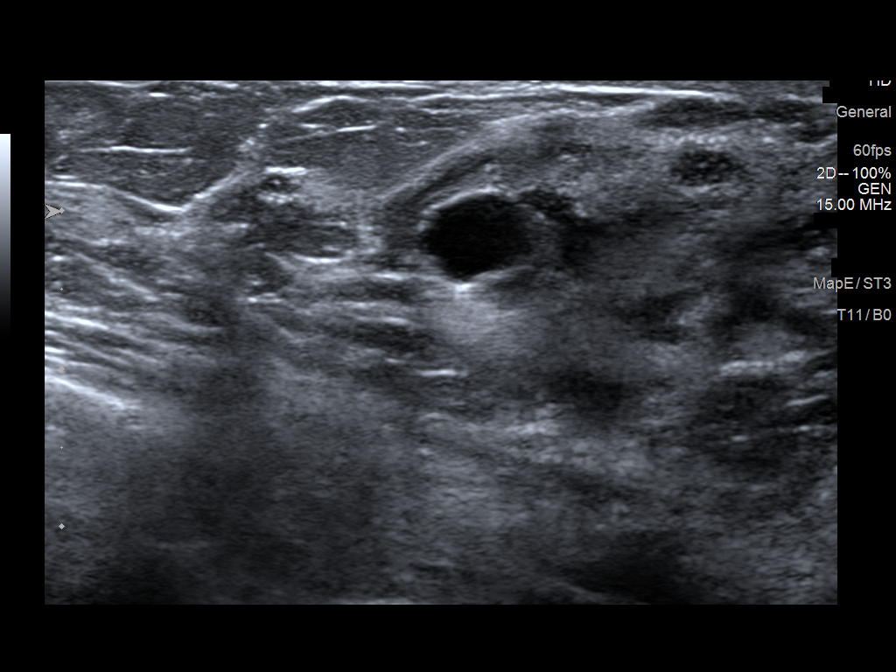
[im 6/8]
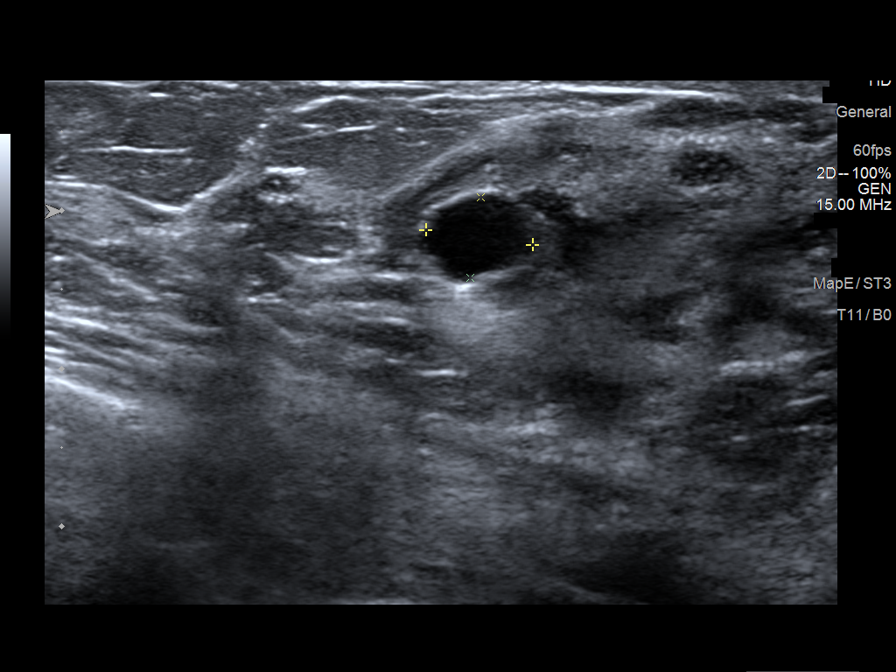
[im 7/8]
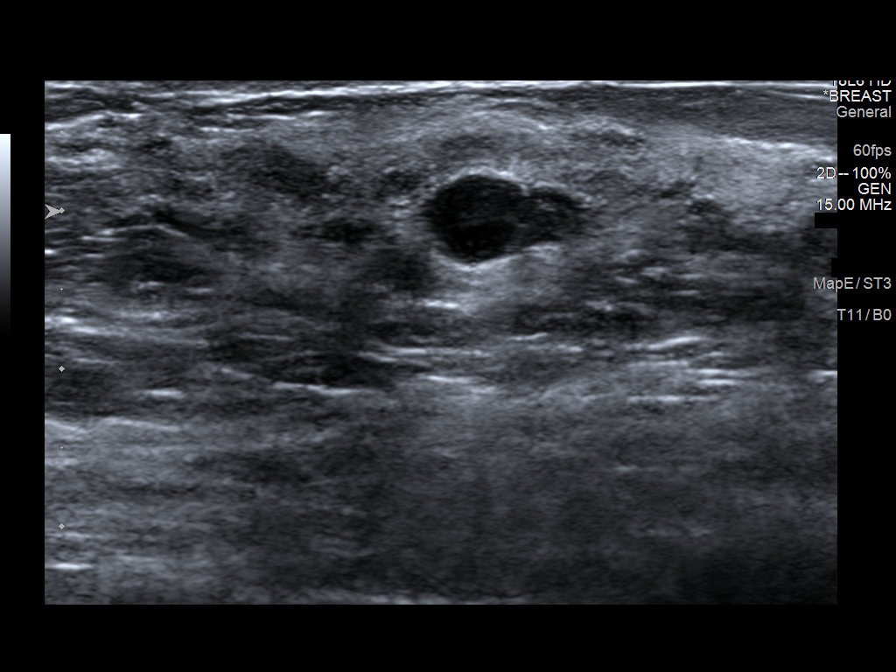
[im 8/8]
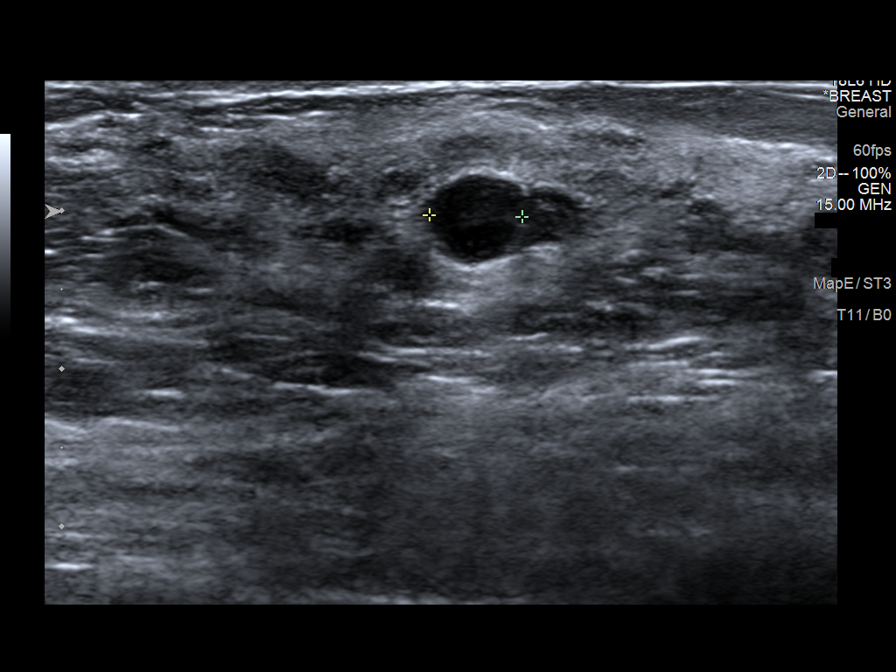

[8 of 8 positions shown; findings below may reference images not displayed]

ACR Breast Density Category c: The breast tissue is heterogeneously
dense, which may obscure small masses.
FINDINGS: There is a round partly circumscribed mass in the upper outer left
breast corresponding to the palpable abnormality. This lies anterior
[DATE] near the nipple.

There are no other discrete masses. There is no architectural
distortion. There are no suspicious calcifications.

Mammographic images were processed with CAD.

On physical exam, there is a mobile smooth mass just above the
nipple. No other palpable masses.

Targeted ultrasound is performed, showing a septated cyst in the 12
o'clock position, 1 cm from the nipple, measuring 19 x 14 x 16 mm.
In the 5 o'clock position, 3 cm the nipple, there is a small oval
cyst measuring 6 x 5 x 7 mm. There are several other subcentimeter
cysts in the upper outer and lateral left breast. There are no solid
masses or suspicious lesions.
IMPRESSION: 1. Benign left breast cysts.  No evidence of malignancy.

RECOMMENDATION:
Screening mammogram at age 38 unless there are persistent or
intervening clinical concerns. (Code:6C-8-ZYV)

I have discussed the findings and recommendations with the patient.
Results were also provided in writing at the conclusion of the
visit. If applicable, a reminder letter will be sent to the patient
regarding the next appointment.

BI-RADS CATEGORY  2: Benign.

## 2018-02-23 DIAGNOSIS — Z1151 Encounter for screening for human papillomavirus (HPV): Secondary | ICD-10-CM | POA: Diagnosis not present

## 2018-02-23 DIAGNOSIS — Z01419 Encounter for gynecological examination (general) (routine) without abnormal findings: Secondary | ICD-10-CM | POA: Diagnosis not present

## 2018-02-23 DIAGNOSIS — Z6825 Body mass index (BMI) 25.0-25.9, adult: Secondary | ICD-10-CM | POA: Diagnosis not present

## 2018-02-25 ENCOUNTER — Other Ambulatory Visit: Payer: Self-pay | Admitting: Obstetrics and Gynecology

## 2018-02-25 DIAGNOSIS — N6310 Unspecified lump in the right breast, unspecified quadrant: Secondary | ICD-10-CM

## 2018-02-25 DIAGNOSIS — Z114 Encounter for screening for human immunodeficiency virus [HIV]: Secondary | ICD-10-CM | POA: Diagnosis not present

## 2018-02-25 DIAGNOSIS — Z0000 Encounter for general adult medical examination without abnormal findings: Secondary | ICD-10-CM | POA: Diagnosis not present

## 2018-02-25 DIAGNOSIS — Z1159 Encounter for screening for other viral diseases: Secondary | ICD-10-CM | POA: Diagnosis not present

## 2018-02-25 DIAGNOSIS — N6320 Unspecified lump in the left breast, unspecified quadrant: Secondary | ICD-10-CM

## 2018-02-25 DIAGNOSIS — Z3042 Encounter for surveillance of injectable contraceptive: Secondary | ICD-10-CM | POA: Diagnosis not present

## 2018-02-25 DIAGNOSIS — Z130 Encounter for screening for diseases of the blood and blood-forming organs and certain disorders involving the immune mechanism: Secondary | ICD-10-CM | POA: Diagnosis not present

## 2018-02-25 DIAGNOSIS — Z113 Encounter for screening for infections with a predominantly sexual mode of transmission: Secondary | ICD-10-CM | POA: Diagnosis not present

## 2018-02-25 DIAGNOSIS — Z1329 Encounter for screening for other suspected endocrine disorder: Secondary | ICD-10-CM | POA: Diagnosis not present

## 2018-02-27 ENCOUNTER — Ambulatory Visit
Admission: RE | Admit: 2018-02-27 | Discharge: 2018-02-27 | Disposition: A | Discharge status: Home / Self Care | Payer: BLUE CROSS/BLUE SHIELD | Source: Ambulatory Visit | Attending: Obstetrics and Gynecology | Admitting: Obstetrics and Gynecology

## 2018-02-27 DIAGNOSIS — N6310 Unspecified lump in the right breast, unspecified quadrant: Secondary | ICD-10-CM

## 2018-02-27 DIAGNOSIS — N6320 Unspecified lump in the left breast, unspecified quadrant: Secondary | ICD-10-CM

## 2018-02-27 DIAGNOSIS — N6489 Other specified disorders of breast: Secondary | ICD-10-CM | POA: Diagnosis not present

## 2018-02-27 DIAGNOSIS — R922 Inconclusive mammogram: Secondary | ICD-10-CM | POA: Diagnosis not present

## 2018-05-13 DIAGNOSIS — Z3042 Encounter for surveillance of injectable contraceptive: Secondary | ICD-10-CM | POA: Diagnosis not present

## 2018-07-29 DIAGNOSIS — Z3042 Encounter for surveillance of injectable contraceptive: Secondary | ICD-10-CM | POA: Diagnosis not present

## 2018-10-14 DIAGNOSIS — Z3042 Encounter for surveillance of injectable contraceptive: Secondary | ICD-10-CM | POA: Diagnosis not present

## 2018-12-31 DIAGNOSIS — Z3042 Encounter for surveillance of injectable contraceptive: Secondary | ICD-10-CM | POA: Diagnosis not present

## 2019-03-19 DIAGNOSIS — Z3042 Encounter for surveillance of injectable contraceptive: Secondary | ICD-10-CM | POA: Diagnosis not present

## 2019-05-05 DIAGNOSIS — Z01419 Encounter for gynecological examination (general) (routine) without abnormal findings: Secondary | ICD-10-CM | POA: Diagnosis not present

## 2019-05-05 DIAGNOSIS — Z114 Encounter for screening for human immunodeficiency virus [HIV]: Secondary | ICD-10-CM | POA: Diagnosis not present

## 2019-05-05 DIAGNOSIS — Z1159 Encounter for screening for other viral diseases: Secondary | ICD-10-CM | POA: Diagnosis not present

## 2019-05-05 DIAGNOSIS — Z6827 Body mass index (BMI) 27.0-27.9, adult: Secondary | ICD-10-CM | POA: Diagnosis not present

## 2019-05-05 DIAGNOSIS — Z113 Encounter for screening for infections with a predominantly sexual mode of transmission: Secondary | ICD-10-CM | POA: Diagnosis not present

## 2019-05-05 DIAGNOSIS — Z118 Encounter for screening for other infectious and parasitic diseases: Secondary | ICD-10-CM | POA: Diagnosis not present

## 2019-05-05 DIAGNOSIS — Z1151 Encounter for screening for human papillomavirus (HPV): Secondary | ICD-10-CM | POA: Diagnosis not present

## 2019-06-03 DIAGNOSIS — N809 Endometriosis, unspecified: Secondary | ICD-10-CM | POA: Diagnosis not present

## 2019-06-03 DIAGNOSIS — R102 Pelvic and perineal pain: Secondary | ICD-10-CM | POA: Diagnosis not present

## 2019-06-03 DIAGNOSIS — G8929 Other chronic pain: Secondary | ICD-10-CM | POA: Diagnosis not present

## 2019-06-03 DIAGNOSIS — N979 Female infertility, unspecified: Secondary | ICD-10-CM | POA: Diagnosis not present

## 2019-06-04 DIAGNOSIS — Z3042 Encounter for surveillance of injectable contraceptive: Secondary | ICD-10-CM | POA: Diagnosis not present

## 2019-06-21 DIAGNOSIS — F430 Acute stress reaction: Secondary | ICD-10-CM | POA: Diagnosis not present

## 2019-06-21 DIAGNOSIS — F411 Generalized anxiety disorder: Secondary | ICD-10-CM | POA: Diagnosis not present

## 2019-07-19 DIAGNOSIS — F411 Generalized anxiety disorder: Secondary | ICD-10-CM | POA: Diagnosis not present

## 2019-07-19 DIAGNOSIS — F430 Acute stress reaction: Secondary | ICD-10-CM | POA: Diagnosis not present

## 2019-08-09 DIAGNOSIS — F411 Generalized anxiety disorder: Secondary | ICD-10-CM | POA: Diagnosis not present

## 2019-08-09 DIAGNOSIS — F430 Acute stress reaction: Secondary | ICD-10-CM | POA: Diagnosis not present

## 2019-08-31 DIAGNOSIS — F430 Acute stress reaction: Secondary | ICD-10-CM | POA: Diagnosis not present

## 2019-08-31 DIAGNOSIS — F411 Generalized anxiety disorder: Secondary | ICD-10-CM | POA: Diagnosis not present

## 2019-09-01 DIAGNOSIS — B302 Viral pharyngoconjunctivitis: Secondary | ICD-10-CM | POA: Diagnosis not present

## 2019-09-01 DIAGNOSIS — R590 Localized enlarged lymph nodes: Secondary | ICD-10-CM | POA: Diagnosis not present

## 2019-09-03 DIAGNOSIS — Z3042 Encounter for surveillance of injectable contraceptive: Secondary | ICD-10-CM | POA: Diagnosis not present

## 2019-09-05 ENCOUNTER — Ambulatory Visit
Admission: EM | Admit: 2019-09-05 | Discharge: 2019-09-05 | Disposition: A | Discharge status: Home / Self Care | Payer: BLUE CROSS/BLUE SHIELD | Attending: Physician Assistant | Admitting: Physician Assistant

## 2019-09-05 ENCOUNTER — Encounter: Payer: Self-pay | Admitting: Physician Assistant

## 2019-09-05 ENCOUNTER — Other Ambulatory Visit: Payer: Self-pay

## 2019-09-05 DIAGNOSIS — R220 Localized swelling, mass and lump, head: Secondary | ICD-10-CM

## 2019-09-05 DIAGNOSIS — I889 Nonspecific lymphadenitis, unspecified: Secondary | ICD-10-CM | POA: Diagnosis not present

## 2019-09-05 MED ORDER — AMOXICILLIN 875 MG PO TABS: 875.0000 mg | ORAL_TABLET | Freq: Two times a day (BID) | ORAL | 0 refills | Status: DC

## 2019-09-05 MED ORDER — IBUPROFEN 800 MG PO TABS: 800.0000 mg | ORAL_TABLET | Freq: Three times a day (TID) | ORAL | 0 refills | Status: DC

## 2019-09-05 NOTE — ED Triage Notes (Signed)
Pt c/o facial swelling, seen by provider prior to triage. No distress

## 2019-09-05 NOTE — ED Provider Notes (Signed)
[name]    CSN: [id number] Arrival date & time: 09/05/19  0954      History   Chief Complaint No chief complaint on file.   HPI Kristine Cummings is a 39 y.o. female.   39 year old female comes in for 2-day history of left facial swelling.  Patient states was eating when she noticed some pain with chewing.  She went to the mirror, and noticed significant facial swelling.  Since then, has had mild pain, with tenderness to palpation of the area.  Denies tripoding, drooling, trismus.  Denies any erythema, warmth.  Denies any dental pain, oral pain.  Denies fever, chills, body aches.  Denies URI symptoms such as cough, congestion, sore throat.  She was seen by ophthalmology a few days ago for conjunctivitis symptoms, and was put on prednisolone.  At the time, had noted lymphadenopathy to the painful area.     Past Medical History:  Diagnosis Date  . Endometrial polyp   . Endometriosis   . GERD (gastroesophageal reflux disease)    uses otc nexium or prilosec if needed  . History of cervical dysplasia    CIN I  in 2005-2006  . Wears contact lenses     There are no problems to display for this patient.   Past Surgical History:  Procedure Laterality Date  . EGG RETRIEVAL W/ PROPOFOL  march 2016  . HYSTEROSCOPY N/A 03/17/2015   Procedure: HYSTEROSCOPY AND ENDOMETRIUM BIOPSY;  Surgeon: Governor Specking, MD;  Location: [place];  Service: Gynecology;  Laterality: N/A;  . ROBOTIC ASSISTED LAPAROSCOPY / RESECTION ENDOMETRIOSIS/  LYSIS CECAL AND APPENDICEAL ADHESIONS  06-18-2011  . WISDOM TOOTH EXTRACTION  as teen    OB History   No obstetric history on file.      Home Medications    Prior to Admission medications   Medication Sig Start Date End Date Taking? Authorizing Provider  amoxicillin (AMOXIL) 875 MG tablet Take 1 tablet (875 mg total) by mouth 2 (two) times daily. 09/05/19   Tasia Catchings, Calaya Gildner V, PA-C  ibuprofen (ADVIL) 800 MG tablet Take 1 tablet  (800 mg total) by mouth 3 (three) times daily. 09/05/19   Tasia Catchings, Nayquan Evinger V, PA-C  esomeprazole (NEXIUM) 20 MG capsule Take 20 mg by mouth as needed.  09/05/19  [provider]  omeprazole (PRILOSEC) 20 MG capsule Take 20 mg by mouth as needed.  09/05/19  [provider]    Family History History reviewed. No pertinent family history.  Social History Social History   Tobacco Use  . Smoking status: Current Every Day Smoker    Packs/day: 0.00    Years: 15.00    Pack years: 0.00    Types: Cigars  . Smokeless tobacco: Never Used  . Tobacco comment: currently smokes 2 cigars daily/  quit 1ppd cigarettes june 2015  Substance Use Topics  . Alcohol use: Yes    Comment: occasional  . Drug use: No     Allergies   Patient has no known allergies.   Review of Systems Review of Systems  Reason unable to perform ROS: See HPI as above.     Physical Exam Triage Vital Signs ED Triage Vitals [09/05/19 1132]  Enc Vitals Group     BP 104/68     Pulse Rate 67     Resp 16     Temp 98.3 F (36.8 C)     Temp Source Oral     SpO2 98 %  Weight      Height      Head Circumference      Peak Flow      Pain Score      Pain Loc      Pain Edu?      Excl. in GC?    No data found.  Updated Vital Signs BP 104/68 (BP Location: Left Arm)   Pulse 67   Temp 98.3 F (36.8 C) (Oral)   Resp 16   SpO2 98%   Visual Acuity Right Eye Distance:   Left Eye Distance:   Bilateral Distance:    Right Eye Near:   Left Eye Near:    Bilateral Near:     Physical Exam Constitutional:      General: She is not in acute distress.    Appearance: Normal appearance. She is not ill-appearing, toxic-appearing or diaphoretic.  HENT:     Head: Normocephalic and atraumatic.     Mouth/Throat:     Mouth: Mucous membranes are moist.     Pharynx: Oropharynx is clear. Uvula midline.     Comments: No tenderness to palpation of the gums. No obvious dental abscess. Floor of mouth soft to palpation.     Left facial swelling mainly to preauricular area. No erythema, mild warmth. Tenderness to palpation with lymphadenopathy of the preauricular and tonsillar lymph nodes to the left.  Eyes:     Extraocular Movements: Extraocular movements intact.     Conjunctiva/sclera:     Right eye: Right conjunctiva is injected.     Left eye: Left conjunctiva is not injected.     Pupils: Pupils are equal, round, and reactive to light.  Cardiovascular:     Rate and Rhythm: Normal rate and regular rhythm.     Heart sounds: Normal heart sounds. No murmur. No friction rub. No gallop.   Pulmonary:     Effort: Pulmonary effort is normal. No accessory muscle usage, prolonged expiration, respiratory distress or retractions.     Comments: Lungs clear to auscultation without adventitious lung sounds. Musculoskeletal:     Cervical back: Normal range of motion and neck supple.  Neurological:     General: No focal deficit present.     Mental Status: She is alert and oriented to person, place, and time.      UC Treatments / Results  Labs (all labs ordered are listed, but only abnormal results are displayed) Labs Reviewed - No data to display  EKG   Radiology No results found.  Procedures Procedures (including critical care time)  Medications Ordered in UC Medications - No data to display  Initial Impression / Assessment and Plan / UC Course  I have reviewed the triage vital signs and the nursing notes.  Pertinent labs & imaging results that were available during my care of the patient were reviewed by me and considered in my medical decision making (see chart for details).    No alarming signs on exam.  Patient handling own secretions well.  No oral swelling.  Facial swelling to the left mainly at preauricular area with lymphadenopathy to the preauricular and tonsillar lymph nodes.  Slight warmth or tenderness to palpation.  Will cover for lymphadenitis with amoxicillin.  Discussed possible  salivary stone causing symptoms as well.  Symptomatic treatment discussed.  Push fluids.  Return precautions given.  Patient expresses understanding and agrees to plan.  Final Clinical Impressions(s) / UC Diagnoses   Final diagnoses:  Left facial swelling   ED Prescriptions  Medication Sig Dispense Auth. Provider   amoxicillin (AMOXIL) 875 MG tablet Take 1 tablet (875 mg total) by mouth 2 (two) times daily. 14 tablet Royalti Schauf V, PA-C   ibuprofen (ADVIL) 800 MG tablet Take 1 tablet (800 mg total) by mouth 3 (three) times daily. 21 tablet Belinda Fisher, PA-C     PDMP not reviewed this encounter.   Belinda Fisher, PA-C 09/05/19 1411

## 2019-09-05 NOTE — Discharge Instructions (Signed)
Start amoxicillin and ibuprofen as directed. Warm compress to the area. Follow up with PCP if symptoms not improving. Monitor for any worsening of symptoms, swelling of the throat, trouble breathing, trouble swallowing, leaning forward to breath, drooling, go to the emergency department for further evaluation needed.

## 2019-09-14 DIAGNOSIS — F411 Generalized anxiety disorder: Secondary | ICD-10-CM | POA: Diagnosis not present

## 2019-09-14 DIAGNOSIS — F430 Acute stress reaction: Secondary | ICD-10-CM | POA: Diagnosis not present

## 2019-10-04 DIAGNOSIS — F430 Acute stress reaction: Secondary | ICD-10-CM | POA: Diagnosis not present

## 2019-10-04 DIAGNOSIS — F411 Generalized anxiety disorder: Secondary | ICD-10-CM | POA: Diagnosis not present

## 2019-10-18 DIAGNOSIS — F411 Generalized anxiety disorder: Secondary | ICD-10-CM | POA: Diagnosis not present

## 2019-10-18 DIAGNOSIS — F430 Acute stress reaction: Secondary | ICD-10-CM | POA: Diagnosis not present

## 2019-11-02 DIAGNOSIS — F430 Acute stress reaction: Secondary | ICD-10-CM | POA: Diagnosis not present

## 2019-11-02 DIAGNOSIS — F411 Generalized anxiety disorder: Secondary | ICD-10-CM | POA: Diagnosis not present

## 2019-11-15 DIAGNOSIS — F411 Generalized anxiety disorder: Secondary | ICD-10-CM | POA: Diagnosis not present

## 2019-11-15 DIAGNOSIS — F430 Acute stress reaction: Secondary | ICD-10-CM | POA: Diagnosis not present

## 2019-11-22 DIAGNOSIS — Z3042 Encounter for surveillance of injectable contraceptive: Secondary | ICD-10-CM | POA: Diagnosis not present

## 2019-11-29 DIAGNOSIS — F430 Acute stress reaction: Secondary | ICD-10-CM | POA: Diagnosis not present

## 2019-11-29 DIAGNOSIS — F411 Generalized anxiety disorder: Secondary | ICD-10-CM | POA: Diagnosis not present

## 2019-12-13 DIAGNOSIS — F430 Acute stress reaction: Secondary | ICD-10-CM | POA: Diagnosis not present

## 2019-12-13 DIAGNOSIS — F411 Generalized anxiety disorder: Secondary | ICD-10-CM | POA: Diagnosis not present

## 2019-12-28 DIAGNOSIS — F430 Acute stress reaction: Secondary | ICD-10-CM | POA: Diagnosis not present

## 2019-12-28 DIAGNOSIS — F411 Generalized anxiety disorder: Secondary | ICD-10-CM | POA: Diagnosis not present

## 2020-01-06 DIAGNOSIS — L0291 Cutaneous abscess, unspecified: Secondary | ICD-10-CM | POA: Diagnosis not present

## 2020-01-18 DIAGNOSIS — F411 Generalized anxiety disorder: Secondary | ICD-10-CM | POA: Diagnosis not present

## 2020-01-18 DIAGNOSIS — F430 Acute stress reaction: Secondary | ICD-10-CM | POA: Diagnosis not present

## 2020-02-01 DIAGNOSIS — F430 Acute stress reaction: Secondary | ICD-10-CM | POA: Diagnosis not present

## 2020-02-01 DIAGNOSIS — F411 Generalized anxiety disorder: Secondary | ICD-10-CM | POA: Diagnosis not present

## 2020-02-14 DIAGNOSIS — Z3042 Encounter for surveillance of injectable contraceptive: Secondary | ICD-10-CM | POA: Diagnosis not present

## 2020-02-14 DIAGNOSIS — F411 Generalized anxiety disorder: Secondary | ICD-10-CM | POA: Diagnosis not present

## 2020-02-14 DIAGNOSIS — F430 Acute stress reaction: Secondary | ICD-10-CM | POA: Diagnosis not present

## 2020-03-06 DIAGNOSIS — F411 Generalized anxiety disorder: Secondary | ICD-10-CM | POA: Diagnosis not present

## 2020-03-06 DIAGNOSIS — F430 Acute stress reaction: Secondary | ICD-10-CM | POA: Diagnosis not present

## 2020-03-20 DIAGNOSIS — F411 Generalized anxiety disorder: Secondary | ICD-10-CM | POA: Diagnosis not present

## 2020-03-20 DIAGNOSIS — F430 Acute stress reaction: Secondary | ICD-10-CM | POA: Diagnosis not present

## 2020-04-03 DIAGNOSIS — F411 Generalized anxiety disorder: Secondary | ICD-10-CM | POA: Diagnosis not present

## 2020-04-03 DIAGNOSIS — F430 Acute stress reaction: Secondary | ICD-10-CM | POA: Diagnosis not present

## 2020-04-17 DIAGNOSIS — F430 Acute stress reaction: Secondary | ICD-10-CM | POA: Diagnosis not present

## 2020-04-17 DIAGNOSIS — F411 Generalized anxiety disorder: Secondary | ICD-10-CM | POA: Diagnosis not present

## 2020-05-02 DIAGNOSIS — Z3042 Encounter for surveillance of injectable contraceptive: Secondary | ICD-10-CM | POA: Diagnosis not present

## 2020-05-08 DIAGNOSIS — F411 Generalized anxiety disorder: Secondary | ICD-10-CM | POA: Diagnosis not present

## 2020-05-08 DIAGNOSIS — F430 Acute stress reaction: Secondary | ICD-10-CM | POA: Diagnosis not present

## 2020-05-29 DIAGNOSIS — F411 Generalized anxiety disorder: Secondary | ICD-10-CM | POA: Diagnosis not present

## 2020-05-29 DIAGNOSIS — F430 Acute stress reaction: Secondary | ICD-10-CM | POA: Diagnosis not present

## 2020-07-10 DIAGNOSIS — F411 Generalized anxiety disorder: Secondary | ICD-10-CM | POA: Diagnosis not present

## 2020-07-10 DIAGNOSIS — F430 Acute stress reaction: Secondary | ICD-10-CM | POA: Diagnosis not present

## 2020-07-18 DIAGNOSIS — Z3042 Encounter for surveillance of injectable contraceptive: Secondary | ICD-10-CM | POA: Diagnosis not present

## 2020-07-25 DIAGNOSIS — F411 Generalized anxiety disorder: Secondary | ICD-10-CM | POA: Diagnosis not present

## 2020-07-25 DIAGNOSIS — F430 Acute stress reaction: Secondary | ICD-10-CM | POA: Diagnosis not present

## 2020-08-07 DIAGNOSIS — F411 Generalized anxiety disorder: Secondary | ICD-10-CM | POA: Diagnosis not present

## 2020-08-07 DIAGNOSIS — F430 Acute stress reaction: Secondary | ICD-10-CM | POA: Diagnosis not present

## 2020-08-08 DIAGNOSIS — Z1159 Encounter for screening for other viral diseases: Secondary | ICD-10-CM | POA: Diagnosis not present

## 2020-08-08 DIAGNOSIS — Z114 Encounter for screening for human immunodeficiency virus [HIV]: Secondary | ICD-10-CM | POA: Diagnosis not present

## 2020-08-08 DIAGNOSIS — Z1231 Encounter for screening mammogram for malignant neoplasm of breast: Secondary | ICD-10-CM | POA: Diagnosis not present

## 2020-08-08 DIAGNOSIS — Z6826 Body mass index (BMI) 26.0-26.9, adult: Secondary | ICD-10-CM | POA: Diagnosis not present

## 2020-08-08 DIAGNOSIS — Z113 Encounter for screening for infections with a predominantly sexual mode of transmission: Secondary | ICD-10-CM | POA: Diagnosis not present

## 2020-08-08 DIAGNOSIS — Z118 Encounter for screening for other infectious and parasitic diseases: Secondary | ICD-10-CM | POA: Diagnosis not present

## 2020-08-08 DIAGNOSIS — Z01419 Encounter for gynecological examination (general) (routine) without abnormal findings: Secondary | ICD-10-CM | POA: Diagnosis not present

## 2020-08-21 DIAGNOSIS — F430 Acute stress reaction: Secondary | ICD-10-CM | POA: Diagnosis not present

## 2020-08-21 DIAGNOSIS — F411 Generalized anxiety disorder: Secondary | ICD-10-CM | POA: Diagnosis not present

## 2021-01-01 ENCOUNTER — Other Ambulatory Visit: Payer: Self-pay

## 2021-01-01 ENCOUNTER — Ambulatory Visit: Payer: BC Managed Care – PPO | Admitting: Pulmonary Disease

## 2021-01-01 ENCOUNTER — Encounter: Payer: Self-pay | Admitting: Pulmonary Disease

## 2021-01-01 VITALS — BP 118/82 | HR 88 | Temp 97.4°F | Ht 65.0 in | Wt 169.8 lb

## 2021-01-01 DIAGNOSIS — J438 Other emphysema: Secondary | ICD-10-CM

## 2021-01-01 MED ORDER — SPIRIVA RESPIMAT 2.5 MCG/ACT IN AERS: 2.0000 | INHALATION_SPRAY | Freq: Every day | RESPIRATORY_TRACT | 0 refills | Status: DC

## 2021-01-01 NOTE — Patient Instructions (Addendum)
Start spiriva respimat 2.50mcg 2 puffs daily. Please call us if you notice benefit from using this inhaler and we will send in a prescription.  Continue to use albuterol as needed.   We will send in a record request for a copy of the CT Chest scan images. You can also call Patient Records at [facility] and request to pick up a copy of the CT chest scan on a CD.   We will have you follow up in 3 months with pulmonary function tests.  Recommend complete smoking cessation.

## 2021-01-01 NOTE — Progress Notes (Signed)
Synopsis: Referred in May 2022 for chest pain  Subjective:   PATIENT ID: Kristine Cummings GENDER: female DOB: 1981/08/11, MRN: [id number]   HPI  Chief Complaint  Patient presents with  . Consult    Referred by PCP for abnormal CT scan that showed emphysema. States she stopped smoking cigars about 2 weeks ago and has noticed that the chest pain has gone.     Kristine Cummings is a 40 year old woman, recent former smoker who is referred to pulmonary clinic for CT Chest scan that showed emphysema.   She was recently seen by her primary care physician for chest pains. She had a CT Chest scan on 11/29/20 that showed mild paraseptal emphysematous changes of the lung apices along with minimal biapical pleuroparenchymal scarring. No nodules or masses noted.   She reports smoking cigarettes for about 15 years and quit 5 years ago. She has been smoking 3-4 black & mild cigars since quitting cigarettes. She reports after smoking cigars, she will wake up with chest discomfort and shortness of breath. She has quit smoking about 2 weeks ago and has already noticed an improvement in her breathing and a decrease in her episodes of chest discomfort.    Past Medical History:  Diagnosis Date  . Endometrial polyp   . Endometriosis   . GERD (gastroesophageal reflux disease)    uses otc nexium or prilosec if needed  . History of cervical dysplasia    CIN I  in 2005-2006  . Wears contact lenses      No family history on file.   Social History   Socioeconomic History  . Marital status: Single    Spouse name: Not on file  . Number of children: Not on file  . Years of education: Not on file  . Highest education level: Not on file  Occupational History  . Not on file  Tobacco Use  . Smoking status: Former Smoker    Packs/day: 0.00    Years: 15.00    Pack years: 0.00    Types: Cigars  . Smokeless tobacco: Never Used  . Tobacco comment: currently smokes 2 cigars daily/  quit 1ppd cigarettes june 2015   Vaping Use  . Vaping Use: Never used  Substance and Sexual Activity  . Alcohol use: Yes    Comment: occasional  . Drug use: No  . Sexual activity: Not on file  Other Topics Concern  . Not on file  Social History Narrative  . Not on file   Social Determinants of Health   Financial Resource Strain: Not on file  Food Insecurity: Not on file  Transportation Needs: Not on file  Physical Activity: Not on file  Stress: Not on file  Social Connections: Not on file  Intimate Partner Violence: Not on file     No Known Allergies   Outpatient Medications Prior to Visit  Medication Sig Dispense Refill  . albuterol (VENTOLIN HFA) 108 (90 Base) MCG/ACT inhaler Inhale 2 puffs into the lungs every 6 (six) hours as needed.    . medroxyPROGESTERone (DEPO-PROVERA) 150 MG/ML injection Inject into the muscle.    . pantoprazole (PROTONIX) 40 MG tablet Take 1 tablet by mouth every morning.    Marland Kitchen amoxicillin (AMOXIL) 875 MG tablet Take 1 tablet (875 mg total) by mouth 2 (two) times daily. 14 tablet 0  . ibuprofen (ADVIL) 800 MG tablet Take 1 tablet (800 mg total) by mouth 3 (three) times daily. 21 tablet 0   No facility-administered  medications prior to visit.    Review of Systems  Constitutional: Negative for chills, fever, malaise/fatigue and weight loss.  HENT: Negative for congestion, sinus pain and sore throat.   Eyes: Negative.   Respiratory: Positive for cough, shortness of breath and wheezing. Negative for hemoptysis and sputum production.   Cardiovascular: Positive for chest pain. Negative for palpitations, orthopnea, claudication and leg swelling.  Gastrointestinal: Negative for abdominal pain, heartburn, nausea and vomiting.  Genitourinary: Negative.   Musculoskeletal: Negative for joint pain and myalgias.  Skin: Negative for rash.  Neurological: Negative for weakness.  Endo/Heme/Allergies: Negative.   Psychiatric/Behavioral: Negative.     Objective:   Vitals:   01/01/21 1612   BP: 118/82  Pulse: 88  Temp: (!) 97.4 F (36.3 C)  TempSrc: Temporal  SpO2: 98%  Weight: 169 lb 12.8 oz (77 kg)  Height: 5\' 5"  (1.651 m)   Physical Exam Constitutional:      General: She is not in acute distress.    Appearance: Normal appearance. She is normal weight. She is not ill-appearing.  HENT:     Head: Normocephalic and atraumatic.  Eyes:     General: No scleral icterus.    Conjunctiva/sclera: Conjunctivae normal.     Pupils: Pupils are equal, round, and reactive to light.  Cardiovascular:     Rate and Rhythm: Normal rate and regular rhythm.     Pulses: Normal pulses.     Heart sounds: Normal heart sounds. No murmur heard.   Pulmonary:     Effort: Pulmonary effort is normal.     Breath sounds: Normal breath sounds. No wheezing, rhonchi or rales.  Abdominal:     General: Bowel sounds are normal.     Palpations: Abdomen is soft.  Musculoskeletal:     Right lower leg: No edema.     Left lower leg: No edema.  Lymphadenopathy:     Cervical: No cervical adenopathy.  Skin:    General: Skin is warm and dry.  Neurological:     General: No focal deficit present.     Mental Status: She is alert.  Psychiatric:        Mood and Affect: Mood normal.        Behavior: Behavior normal.        Thought Content: Thought content normal.        Judgment: Judgment normal.    CBC    Component Value Date/Time   WBC 7.5 06/17/2011 1540   RBC 4.07 06/17/2011 1540   HGB 13.6 03/17/2015 1222   HCT 35.4 (L) 06/17/2011 1540   PLT 245 06/17/2011 1540   MCV 87.0 06/17/2011 1540   MCH 28.3 06/17/2011 1540   MCHC 32.5 06/17/2011 1540   RDW 13.5 06/17/2011 1540   Labs: 10/23/20 - Care Everywhere TSH, CBC, CMP - normal  Chest imaging: CT Chest 11/29/20 - Report reviewed from [facility] Mild findings of paraseptal emphysema at the lung apices.   PFT: No flowsheet data found.    Assessment & Plan:   Other emphysema (HCC) - Plan: Pulmonary function test  Discussion: Kristine Cummings is a 40 year old woman, recent former smoker who is referred to pulmonary clinic for CT Chest scan that showed emphysema.   She has mild paraseptal apical emphysematous changes per CT chest scan at [facility]. We will request a copy of the CT chest for review. Patient is to be schedule for pulmonary function tests. We will start her on Spiriva respimat 2.56mcg 2 puffs  daily.   We discussed smoking cessation and are proud that she has quit 2 weeks ago. She understands that if she were to keep smoking her lung function and emphysematous changes would only worsen.  She is to follow up in 3 months.  Melody Comas, MD [facility] Office: [phone]   Current Outpatient Medications:  .  albuterol (VENTOLIN HFA) 108 (90 Base) MCG/ACT inhaler, Inhale 2 puffs into the lungs every 6 (six) hours as needed., Disp: , Rfl:  .  medroxyPROGESTERone (DEPO-PROVERA) 150 MG/ML injection, Inject into the muscle., Disp: , Rfl:  .  pantoprazole (PROTONIX) 40 MG tablet, Take 1 tablet by mouth every morning., Disp: , Rfl:  .  Tiotropium Bromide Monohydrate (SPIRIVA RESPIMAT) 2.5 MCG/ACT AERS, Inhale 2 puffs into the lungs daily., Disp: 4 g, Rfl: 0

## 2021-01-01 NOTE — Progress Notes (Signed)
Patient seen in the office today and instructed on use of Spiriva 2.70mcg.  Patient expressed understanding and demonstrated technique.  Kristine Cummings [facility] 01/01/2021

## 2021-01-12 ENCOUNTER — Other Ambulatory Visit (HOSPITAL_COMMUNITY)
Admission: RE | Admit: 2021-01-12 | Discharge: 2021-01-12 | Disposition: A | Discharge status: Home / Self Care | Payer: BC Managed Care – PPO | Source: Ambulatory Visit | Attending: Pulmonary Disease | Admitting: Pulmonary Disease

## 2021-01-12 DIAGNOSIS — Z20822 Contact with and (suspected) exposure to covid-19: Secondary | ICD-10-CM | POA: Insufficient documentation

## 2021-01-12 DIAGNOSIS — Z01812 Encounter for preprocedural laboratory examination: Secondary | ICD-10-CM | POA: Diagnosis not present

## 2021-01-12 LAB — SARS CORONAVIRUS 2 (TAT 6-24 HRS): SARS Coronavirus 2: NEGATIVE

## 2021-01-15 ENCOUNTER — Other Ambulatory Visit: Payer: Self-pay

## 2021-01-15 ENCOUNTER — Ambulatory Visit (INDEPENDENT_AMBULATORY_CARE_PROVIDER_SITE_OTHER): Payer: BC Managed Care – PPO | Admitting: Pulmonary Disease

## 2021-01-15 DIAGNOSIS — J438 Other emphysema: Secondary | ICD-10-CM

## 2021-01-15 LAB — PULMONARY FUNCTION TEST
DL/VA % pred: 88 %
DL/VA: 4.36 ml/min/mmHg/L
DLCO cor % pred: 82 %
DLCO cor: 21.22 ml/min/mmHg
DLCO unc % pred: 82 %
DLCO unc: 21.22 ml/min/mmHg
FEF 25-75 Post: 3.09 L/sec
FEF 25-75 Pre: 3.2 L/sec
FEF2575-%Change-Post: -3 %
FEF2575-%Pred-Post: 109 %
FEF2575-%Pred-Pre: 112 %
FEV1-%Change-Post: 1 %
FEV1-%Pred-Post: 114 %
FEV1-%Pred-Pre: 113 %
FEV1-Post: 3.05 L
FEV1-Pre: 3.02 L
FEV1FVC-%Change-Post: 0 %
FEV1FVC-%Pred-Pre: 100 %
FEV6-%Change-Post: 1 %
FEV6-%Pred-Post: 119 %
FEV6-%Pred-Pre: 117 %
FEV6-Post: 3.71 L
FEV6-Pre: 3.65 L
FEV6FVC-%Pred-Post: 104 %
FEV6FVC-%Pred-Pre: 104 %
FVC-%Change-Post: 1 %
FVC-%Pred-Post: 114 %
FVC-%Pred-Pre: 112 %
FVC-Post: 3.71 L
FVC-Pre: 3.65 L
Post FEV1/FVC ratio: 82 %
Post FEV6/FVC ratio: 100 %
Pre FEV1/FVC ratio: 83 %
Pre FEV6/FVC Ratio: 100 %
RV % pred: 130 %
RV: 2.15 L
TLC % pred: 110 %
TLC: 5.74 L

## 2021-01-15 MED ORDER — SPIRIVA RESPIMAT 2.5 MCG/ACT IN AERS: 2.0000 | INHALATION_SPRAY | Freq: Every day | RESPIRATORY_TRACT | 0 refills | Status: AC

## 2021-01-15 NOTE — Progress Notes (Signed)
Full PFT performed today. Patient requesting refill of Spiriva Resp. Per last office visit she was informed to call office back if she felt it helped.

## 2021-01-15 NOTE — Patient Instructions (Signed)
Full PFT performed today. °

## 2021-05-01 ENCOUNTER — Ambulatory Visit: Payer: BC Managed Care – PPO | Attending: Nurse Practitioner

## 2021-05-01 ENCOUNTER — Other Ambulatory Visit: Payer: Self-pay

## 2021-05-01 DIAGNOSIS — M542 Cervicalgia: Secondary | ICD-10-CM

## 2021-05-01 DIAGNOSIS — R293 Abnormal posture: Secondary | ICD-10-CM | POA: Diagnosis present

## 2021-05-01 DIAGNOSIS — M25512 Pain in left shoulder: Secondary | ICD-10-CM

## 2021-05-01 DIAGNOSIS — M6281 Muscle weakness (generalized): Secondary | ICD-10-CM

## 2021-05-01 DIAGNOSIS — R252 Cramp and spasm: Secondary | ICD-10-CM

## 2021-05-01 NOTE — Patient Instructions (Signed)
Access Code: [email or web address] URL: [email or web address] Date: 05/01/2021 Prepared by: Claude Manges  Exercises Seated Assisted Cervical Rotation with Towel - 1 x daily - 7 x weekly - 10 reps - 3-5 seconds hold cervical extension snag with towel - 1 x daily - 7 x weekly - 10 reps - 3-5 seconds hold Seated Cervical Retraction - 1 x daily - 7 x weekly - 2 sets - 5 reps Seated Scapular Retraction - 1 x daily - 7 x weekly - 2 sets - 10 reps - 3 seconds hold Heat - 1 x daily - 7 x weekly - 10-20 minutes hold

## 2021-05-01 NOTE — Therapy (Signed)
[facility] [address] [place], [place], [address] Phone: [phone]   Fax:  [phone]  Physical Therapy Evaluation  Patient Details  Name: Kristine Cummings MRN: [id number] Date of Birth: 1981/02/20 Referring Provider (PT): Zoe Lan, NP   Encounter Date: 05/01/2021   PT End of Session - 05/01/21 1857     Visit Number 1    Date for PT Re-Evaluation 06/26/21    PT Start Time 1815    PT Stop Time 1850    PT Time Calculation (min) 35 min    Activity Tolerance Patient tolerated treatment well;Patient limited by pain    Behavior During Therapy [facility] for tasks assessed/performed             Past Medical History:  Diagnosis Date   Endometrial polyp    Endometriosis    GERD (gastroesophageal reflux disease)    uses otc nexium or prilosec if needed   History of cervical dysplasia    CIN I  in 2005-2006   Wears contact lenses     Past Surgical History:  Procedure Laterality Date   EGG RETRIEVAL W/ PROPOFOL  march 2016   HYSTEROSCOPY N/A 03/17/2015   Procedure: HYSTEROSCOPY AND ENDOMETRIUM BIOPSY;  Surgeon: Fermin Schwab, MD;  Location: [facility];  Service: Gynecology;  Laterality: N/A;   ROBOTIC ASSISTED LAPAROSCOPY / RESECTION ENDOMETRIOSIS/  LYSIS CECAL AND APPENDICEAL ADHESIONS  06-18-2011   WISDOM TOOTH EXTRACTION  as teen    There were no vitals filed for this visit.    Subjective Assessment - 05/01/21 1817     Subjective about 3 weeks ago just woke up with left sided neck pain into left shoulder (proximal). Achy most of the time and if moves neck certain way, will get sharp pain. Hurts when laughing, about to sneeze, deep breath in.    Pertinent History emphysema, recent diagnosis    Diagnostic tests 04/20/21: Chest XR PA and Lateral: Cardiomediastinal silhouette unchanged in size and contour. No evidence of central vascular congestion. No interlobular septal thickening. No pneumothorax or  pleural effusion. No confluent  airspace disease. No acute displaced fracture    Patient Stated Goals to get rid of pain, be comfortable with daily activities    Currently in Pain? Yes    Pain Score 8     Pain Location Neck    Pain Orientation Left    Pain Descriptors / Indicators Aching;Sharp    Pain Radiating Towards denies N/T into arm    Pain Onset 1 to 4 weeks ago    Pain Frequency Constant    Pain Relieving Factors nothing - tried tramadol and mms relaxers didnt help much                [facility] PT Assessment - 05/01/21 1815       Assessment   Medical Diagnosis Cervicalgia    Referring Provider (PT) Zoe Lan, NP      Home Environment   Additional Comments house, alone.      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      Cognition   Overall Cognitive Status Within Functional Limits for tasks assessed      ROM / Strength   AROM / PROM / Strength AROM;Strength      AROM   Overall AROM Comments Cerivcal: full flexion  with pain end range L>R. Extension full WFL less pain. Lateral flexion + pain to the left, less pain to the right, both 40 deg. Rotation: 60 deg left, 70 deg right.    AROM Assessment Site Shoulder    Right/Left Shoulder Left    Left Shoulder Flexion --   Full and WFL flexion and abd with TTP L shoulder.     Strength   Overall Strength Comments R shoulder grossly 5/5, L shoudler grossly 3-/5 limited by pain      Palpation   Spinal mobility hypomobile to unilateral P-A cervical and upper thoracic left moreso than right, with pain exacerbation/tenderness. hypomobile first rib mobs left more than right side but did report some relief with gentle mobs    Palpation comment TTP anterior, superior, lateral L shoulder mms, L UT/LS.                        Objective measurements completed on examination:  See above findings.           PT Education - 05/01/21 1856     Education Details PT initial POC, PT findings, initial HEP with emphasis on graded mobility. Access Code: [email or web address]    Person(s) Educated Patient    Methods Explanation;Demonstration;Handout    Comprehension Verbalized understanding;Returned demonstration;Need further instruction              PT Short Term Goals - 05/01/21 1906       PT SHORT TERM GOAL #1   Title Independent with initial HEP    Time 2    Period Weeks    Status New    Target Date 05/15/21               PT Long Term Goals - 05/01/21 1906       PT LONG TERM GOAL #1   Title FOTO improved to at least 64% for function    Baseline 43%    Time 8    Period Weeks    Status New    Target Date 06/26/21      PT LONG TERM GOAL #2   Title Independent with advanced HEP    Time 8    Period Weeks    Status New    Target Date 06/26/21      PT LONG TERM GOAL #3   Title Cervical ROM full and symmetrical with no greater than 1/10 pain or discomfort    Time 8    Period Weeks    Status New    Target Date 06/26/21      PT LONG TERM GOAL #4   Title BUE  and postural mms strength symmetrical to at least 4+/5    Time 8    Period Weeks    Target Date 06/26/21                    Plan - 05/01/21 1857     Clinical Impression Statement Pt is a 40 yo female who presents with 3 week history of left sided neck pain into left shoulder that has been constant and limiting ability to complete daily activities. She currently presents with limited cervical ROM (left rotation and lateral flexion most exacerbating to pain), painful shoulder ROM, decreased cervical spine and first rib mobility L  worse than right with potential facet joint involvement  decreased postural and shoulder strength, and decreased neck/shoulder function as evidenced by score of 43% on FOTO.  She does  report feeling as though neck needs to "pop". She will benefir from  skilled PT to work on improving functional ROM of neck and shoudler, decreasing pain, improving functional strength.    Stability/Clinical Decision Making Stable/Uncomplicated    Clinical Decision Making Low    Rehab Potential Good    PT Frequency 1x / week    PT Duration 8 weeks    PT Treatment/Interventions ADLs/Self Care Home Management;Cryotherapy;Electrical Stimulation;Iontophoresis 4mg /ml Dexamethasone;Moist Heat;Traction;Functional mobility training;Therapeutic activities;Therapeutic exercise;Neuromuscular re-education;Manual techniques;Patient/family education;Taping    PT Next Visit Plan Reassess ad progress HEP as tolerated. Gentle progression of cervical and ashoulder ROM, strengthening/stability as tolerated. Pt may benefit from joint mobs, potential cervical traction. Manual and modalities as needed.    PT Home Exercise Plan see pt instructions    Consulted and Agree with Plan of Care Patient             Patient will benefit from skilled therapeutic intervention in order to improve the following deficits and impairments:     Visit Diagnosis: Cervicalgia - Plan: PT plan of care cert/re-cert  Muscle weakness (generalized) - Plan: PT plan of care cert/re-cert  Acute pain of left shoulder - Plan: PT plan of care cert/re-cert  Abnormal posture - Plan: PT plan of care cert/re-cert  Cramp and spasm - Plan: PT plan of care cert/re-cert     Problem List There are no problems to display for this patient.   , PT, DPT 05/02/2021, 11:17 AM  [facility] [address] [place], [place], [place] Phone: [phone]   Fax:  [phone]  Name: Kristine Cummings MRN: Allyn Kenner Date of Birth: 21-Jan-1981

## 2021-12-11 ENCOUNTER — Encounter: Payer: Self-pay | Admitting: Physical Therapy

## 2021-12-11 NOTE — Therapy (Signed)
[facility] [address] ?[place], [place], [address] ?Phone: [phone]   Fax:  [phone] ? ?Patient Details  ?Name: Kristine Cummings ?MRN: [id number] ?Date of Birth: [birth year] ?Referring Provider:  No ref. provider found ? ?Encounter Date: 12/11/2021 ? ?PHYSICAL THERAPY DISCHARGE SUMMARY ? ?Visits from Start of Care: 1 ? ?Current functional level related to goals / functional outcomes: ?Did not return since last visit  ?  ?Remaining deficits: ?Unable to assess  ?  ?Education / Equipment: ?N/A   ? ?Patient agrees to discharge. Patient goals were not met. Patient is being discharged due to not returning since the last visit. ? ? ?Saadiya Wilfong U PT, DPT, PN2  ? ?Supplemental Physical Therapist ?[place]  ? ? ? ? ? ?[place] [address] ?[place], [place], [address] ?Phone: [phone]   Fax:  [phone] ?

## 2023-01-04 ENCOUNTER — Ambulatory Visit
Admission: EM | Admit: 2023-01-04 | Discharge: 2023-01-04 | Disposition: A | Discharge status: Home / Self Care | Payer: BC Managed Care – PPO

## 2023-01-04 DIAGNOSIS — L209 Atopic dermatitis, unspecified: Secondary | ICD-10-CM

## 2023-01-04 MED ORDER — METHYLPREDNISOLONE ACETATE 40 MG/ML IJ SUSP
40.0000 mg | Freq: Once | INTRAMUSCULAR | Status: AC
  Administered 2023-01-04: 40 mg via INTRAMUSCULAR

## 2023-01-04 NOTE — ED Triage Notes (Signed)
Pt reports eczema flare up in arms x 1 week. Eucrisa gives no relief.

## 2023-01-04 NOTE — ED Provider Notes (Signed)
[facility]  Note:  This document was prepared using Conservation officer, historic buildings and may include unintentional dictation errors.  MRN: [id number] DOB: 01/18/1981  Subjective:   Kristine Cummings is a 42 y.o. female presenting for 1 week history of recurrent eczema flareup.  Patient usually has a very difficult time in the spring.  Cannot tolerate topical steroids.  Has been using her Eucrisa with minimal relief.  Usually this will help but when it does not, she does better with getting a steroid injection.  Would like to have this done today.  Does not have a dermatologist.  No current facility-administered medications for this encounter.  Current Outpatient Medications:    Crisaborole (EUCRISA) 2 % OINT, Apply topically., Disp: , Rfl:    albuterol (VENTOLIN HFA) 108 (90 Base) MCG/ACT inhaler, Inhale 2 puffs into the lungs every 6 (six) hours as needed., Disp: , Rfl:    medroxyPROGESTERone (DEPO-PROVERA) 150 MG/ML injection, Inject into the muscle., Disp: , Rfl:    pantoprazole (PROTONIX) 40 MG tablet, Take 1 tablet by mouth every morning., Disp: , Rfl:    Tiotropium Bromide Monohydrate (SPIRIVA RESPIMAT) 2.5 MCG/ACT AERS, Inhale 2 puffs into the lungs daily., Disp: 4 g, Rfl: 0   No Known Allergies  Past Medical History:  Diagnosis Date   Endometrial polyp    Endometriosis    GERD (gastroesophageal reflux disease)    uses otc nexium or prilosec if needed   History of cervical dysplasia    CIN I  in 2005-2006   Wears contact lenses      Past Surgical History:  Procedure Laterality Date   EGG RETRIEVAL W/ PROPOFOL  march 2016   HYSTEROSCOPY N/A 03/17/2015   Procedure: HYSTEROSCOPY AND ENDOMETRIUM BIOPSY;  Surgeon: Fermin Schwab, MD;  Location: [facility];  Service: Gynecology;  Laterality: N/A;   ROBOTIC ASSISTED LAPAROSCOPY / RESECTION ENDOMETRIOSIS/  LYSIS CECAL AND APPENDICEAL ADHESIONS  06-18-2011   WISDOM TOOTH EXTRACTION  as teen     History reviewed. No pertinent family history.  Social History   Tobacco Use   Smoking status: Former    Packs/day: 0.00    Years: 15.00    Additional pack years: 0.00    Total pack years: 0.00    Types: Cigars, Cigarettes   Smokeless tobacco: Never   Tobacco comments:    currently smokes 2 cigars daily/  quit 1ppd cigarettes june 2015  Vaping Use   Vaping Use: Never used  Substance Use Topics   Alcohol use: Yes    Comment: occasional   Drug use: No    ROS   Objective:   Vitals: BP 122/81 (BP Location: Right Arm)   Pulse 82   Temp 98.9 F (37.2 C) (Oral)   Resp 16   LMP  (Within Weeks) Comment: 2 weeks  SpO2 98%   Physical Exam Constitutional:      General: She is not in acute distress.    Appearance: Normal appearance. She is well-developed. She is not ill-appearing, toxic-appearing or diaphoretic.  HENT:     Head: Normocephalic and atraumatic.     Nose: Nose normal.     Mouth/Throat:     Mouth: Mucous membranes are moist.  Eyes:     General: No scleral icterus.       Right eye: No discharge.        Left eye: No discharge.     Extraocular Movements: Extraocular movements intact.  Cardiovascular:  Rate and Rhythm: Normal rate.  Pulmonary:     Effort: Pulmonary effort is normal.  Skin:    General: Skin is warm and dry.     Findings: Rash (multiple hyperpigmented papular patches overlying the flexural surface of the elbow extending laterally to the forearm and distal lateral arms bilaterally) present.  Neurological:     General: No focal deficit present.     Mental Status: She is alert and oriented to person, place, and time.  Psychiatric:        Mood and Affect: Mood normal.        Behavior: Behavior normal.     Assessment and Plan :   PDMP not reviewed this encounter.  1. Atopic dermatitis, unspecified type    As agreeable to use Depo-Medrol injection with the patient at 40 mg which was administered IM in clinic.  Recommend continued  use of the Eucrisa, can also use Aquaphor.  I placed a referral to dermatology for the patient as she has never had a consultation.  Counseled patient on potential for adverse effects with medications prescribed/recommended today, ER and return-to-clinic precautions discussed, patient verbalized understanding.    Wallis Bamberg, PA-C 01/04/23 1012

## 2023-04-24 ENCOUNTER — Ambulatory Visit (INDEPENDENT_AMBULATORY_CARE_PROVIDER_SITE_OTHER): Payer: BC Managed Care – PPO

## 2023-04-24 ENCOUNTER — Ambulatory Visit
Admission: EM | Admit: 2023-04-24 | Discharge: 2023-04-24 | Disposition: A | Discharge status: Home / Self Care | Payer: BC Managed Care – PPO | Attending: Internal Medicine | Admitting: Internal Medicine

## 2023-04-24 DIAGNOSIS — M533 Sacrococcygeal disorders, not elsewhere classified: Secondary | ICD-10-CM

## 2023-04-24 DIAGNOSIS — W19XXXA Unspecified fall, initial encounter: Secondary | ICD-10-CM

## 2023-04-24 DIAGNOSIS — S300XXA Contusion of lower back and pelvis, initial encounter: Secondary | ICD-10-CM

## 2023-04-24 MED ORDER — NAPROXEN 375 MG PO TABS
375.0000 mg | ORAL_TABLET | Freq: Two times a day (BID) | ORAL | 0 refills | Status: DC
Start: 2023-04-24 — End: 2023-04-24

## 2023-04-24 MED ORDER — NAPROXEN 375 MG PO TABS
375.0000 mg | ORAL_TABLET | Freq: Two times a day (BID) | ORAL | 0 refills | Status: AC
Start: 2023-04-24 — End: 2023-05-01

## 2023-04-24 NOTE — ED Triage Notes (Signed)
Pt presents to UC w/ c/o left buttock pain that radiates down left leg while walking. Pt states she slipped and fell on her left buttock yesterday in her kitchen. Pt has taken tylenol and used a heating pad for pain relief.

## 2023-04-24 NOTE — Discharge Instructions (Signed)
Start naproxen twice daily as needed for pain and inflammation.  You may continue Tylenol as needed.  Alternate warm and cool compresses to the area.  Please follow-up with your PCP if your symptoms do not improve.  Please go to the ER for any worsening symptoms.  I hope you feel better soon!

## 2023-04-24 NOTE — ED Provider Notes (Signed)
[facility]    CSN: [id number] Arrival date & time: 04/24/23  1624      History   Chief Complaint No chief complaint on file.   HPI Kristine Cummings is a 42 y.o. female presents for evaluation of a fall.  Patient reports yesterday she slipped in her kitchen falling onto her buttock.  Denies head injury or LOC.  Since then she has been having coccyx/left buttock pain that radiates down her leg when she walks.  Denies any low back pain, neck pain, numbness/tingling/weakness of her lower extremities.  No bowel or bladder incontinence.  No saddle paresthesia.  No history of injuries or surgeries to the affected areas.  She did take some Tylenol which did help her sleep last night.  Denies any other injuries or concerns at this time.  HPI  Past Medical History:  Diagnosis Date   Endometrial polyp    Endometriosis    GERD (gastroesophageal reflux disease)    uses otc nexium or prilosec if needed   History of cervical dysplasia    CIN I  in 2005-2006   Wears contact lenses     There are no problems to display for this patient.   Past Surgical History:  Procedure Laterality Date   EGG RETRIEVAL W/ PROPOFOL  march 2016   HYSTEROSCOPY N/A 03/17/2015   Procedure: HYSTEROSCOPY AND ENDOMETRIUM BIOPSY;  Surgeon: Fermin Schwab, MD;  Location: [facility] [place];  Service: Gynecology;  Laterality: N/A;   ROBOTIC ASSISTED LAPAROSCOPY / RESECTION ENDOMETRIOSIS/  LYSIS CECAL AND APPENDICEAL ADHESIONS  06-18-2011   WISDOM TOOTH EXTRACTION  as teen    OB History   No obstetric history on file.      Home Medications    Prior to Admission medications   Medication Sig Start Date End Date Taking? Authorizing Provider  naproxen (NAPROSYN) 375 MG tablet Take 1 tablet (375 mg total) by mouth 2 (two) times daily for 7 days. 04/24/23 05/01/23 Yes Radford Pax, NP  albuterol (VENTOLIN HFA) 108 (90 Base) MCG/ACT inhaler Inhale 2 puffs into the lungs every 6 (six) hours as needed.  10/23/20   [provider]  Crisaborole (EUCRISA) 2 % OINT Apply topically.    [provider]  medroxyPROGESTERone (DEPO-PROVERA) 150 MG/ML injection Inject into the muscle.    [provider]  pantoprazole (PROTONIX) 40 MG tablet Take 1 tablet by mouth every morning. 10/23/20   [provider]  Tiotropium Bromide Monohydrate (SPIRIVA RESPIMAT) 2.5 MCG/ACT AERS Inhale 2 puffs into the lungs daily. 01/15/21   Martina Sinner, MD  esomeprazole (NEXIUM) 20 MG capsule Take 20 mg by mouth as needed.  09/05/19  [provider]  omeprazole (PRILOSEC) 20 MG capsule Take 20 mg by mouth as needed.  09/05/19  [provider]    Family History History reviewed. No pertinent family history.  Social History Social History   Tobacco Use   Smoking status: Former    Current packs/day: 0.00    Types: Cigars, Cigarettes   Smokeless tobacco: Never   Tobacco comments:    currently smokes 2 cigars daily/  quit 1ppd cigarettes june 2015  Vaping Use   Vaping status: Never Used  Substance Use Topics   Alcohol use: Yes    Comment: occasional   Drug use: No     Allergies   Patient has no known allergies.   Review of Systems Review of Systems  Musculoskeletal:        Tailbone pain/buttock  pain after fall     Physical Exam Triage Vital Signs ED Triage Vitals  Encounter Vitals Group     BP 04/24/23 1640 108/70     Systolic BP Percentile --      Diastolic BP Percentile --      Pulse Rate 04/24/23 1640 62     Resp 04/24/23 1640 16     Temp 04/24/23 1640 97.9 F (36.6 C)     Temp Source 04/24/23 1640 Oral     SpO2 04/24/23 1640 98 %     Weight --      Height --      Head Circumference --      Peak Flow --      Pain Score 04/24/23 1639 9     Pain Loc --      Pain Education --      Exclude from Growth Chart --    No data found.  Updated Vital Signs BP 108/70 (BP Location: Right Arm)   Pulse 62   Temp 97.9 F (36.6 C) (Oral)    Resp 16   SpO2 98%   Visual Acuity Right Eye Distance:   Left Eye Distance:   Bilateral Distance:    Right Eye Near:   Left Eye Near:    Bilateral Near:     Physical Exam Vitals and nursing note reviewed.  Constitutional:      General: She is not in acute distress.    Appearance: Normal appearance. She is not ill-appearing.  HENT:     Head: Normocephalic and atraumatic.  Eyes:     Pupils: Pupils are equal, round, and reactive to light.  Cardiovascular:     Rate and Rhythm: Normal rate.  Pulmonary:     Effort: Pulmonary effort is normal.  Musculoskeletal:       Legs:     Comments: No swelling or bruising of the left buttock or coccyx.  Tender to palpation to the Kocsis and left mid to lateral buttock.  There is no tenderness to palpation to the lumbar spine or paralumbar muscles, hip.  Strength is 5 out of 5 bilateral lower extremities.  Skin:    General: Skin is warm and dry.  Neurological:     General: No focal deficit present.     Mental Status: She is alert and oriented to person, place, and time.  Psychiatric:        Mood and Affect: Mood normal.        Behavior: Behavior normal.      UC Treatments / Results  Labs (all labs ordered are listed, but only abnormal results are displayed) Labs Reviewed - No data to display  EKG   Radiology DG Sacrum/Coccyx  Result Date: 04/24/2023 CLINICAL DATA:  Patient fell from standing yesterday. EXAM: SACRUM AND COCCYX - 2+ VIEW COMPARISON:  None Available. FINDINGS: There is no evidence of fracture or other focal bone lesions. IMPRESSION: Negative. Electronically Signed   By: Kennith Center M.D.   On: 04/24/2023 17:26    Procedures Procedures (including critical care time)  Medications Ordered in UC Medications - No data to display  Initial Impression / Assessment and Plan / UC Course  I have reviewed the triage vital signs and the nursing notes.  Pertinent labs & imaging results that were available during my care of  the patient were reviewed by me and considered in my medical decision making (see chart for details).     Reviewed exam and symptoms with patient.  No red flags.  Discussed likely buttock contusion secondary to fall.  Coccyx x-ray negative.  Trial of naproxen as well as rest and warm/cool compresses.  Continue Tylenol as needed.  PCP follow-up symptoms do not improve.  ER precautions reviewed and patient verbalized understanding Final Clinical Impressions(s) / UC Diagnoses   Final diagnoses:  Pain in the coccyx  Fall, initial encounter  Contusion of buttock, initial encounter     Discharge Instructions      Start naproxen twice daily as needed for pain and inflammation.  You may continue Tylenol as needed.  Alternate warm and cool compresses to the area.  Please follow-up with your PCP if your symptoms do not improve.  Please go to the ER for any worsening symptoms.  I hope you feel better soon!     ED Prescriptions     Medication Sig Dispense Auth. Provider   naproxen (NAPROSYN) 375 MG tablet Take 1 tablet (375 mg total) by mouth 2 (two) times daily for 7 days. 14 tablet Radford Pax, NP      PDMP not reviewed this encounter.   Radford Pax, NP 04/24/23 1736

## 2023-09-09 ENCOUNTER — Encounter: Payer: Self-pay | Admitting: Dermatology

## 2023-09-09 ENCOUNTER — Ambulatory Visit (INDEPENDENT_AMBULATORY_CARE_PROVIDER_SITE_OTHER): Payer: BC Managed Care – PPO | Admitting: Dermatology

## 2023-09-09 VITALS — BP 99/71 | HR 83

## 2023-09-09 DIAGNOSIS — L209 Atopic dermatitis, unspecified: Secondary | ICD-10-CM | POA: Diagnosis not present

## 2023-09-09 MED ORDER — DUPIXENT 300 MG/2ML ~~LOC~~ SOAJ
600.0000 mg | Freq: Once | SUBCUTANEOUS | 0 refills | Status: DC
Start: 2023-09-09 — End: 2023-09-09

## 2023-09-09 MED ORDER — DUPIXENT 300 MG/2ML ~~LOC~~ SOAJ
300.0000 mg | SUBCUTANEOUS | 11 refills | Status: DC
Start: 2023-09-09 — End: 2023-09-09

## 2023-09-09 MED ORDER — EUCRISA 2 % EX OINT
1.0000 | TOPICAL_OINTMENT | Freq: Two times a day (BID) | CUTANEOUS | 6 refills | Status: AC
Start: 2023-09-09 — End: ?

## 2023-09-09 NOTE — Patient Instructions (Addendum)
 Hello Ms. Theta,  Thank you for visiting our clinic today. Your dedication to managing your atopic dermatitis is commendable, and I'm pleased we could explore suitable treatment options for you.  Here is a summary of the key instructions and next steps from today's consultation:  Eucrisa  Prescription: Continue using Eucrisa  as prescribed. Your prescription has been refilled at [facility].  Moisturizing Routine: Regularly moisturize with Aquaphor, especially after showering, to maintain skin hydration.  Dupixent  Treatment Pending Insurance Approval:   Description: Dupixent  is an injectable medication that specifically targets parts of your immune system without suppressing it.   Administration: The medication is to be administered every two weeks. You will be able to self-inject at home using a process akin to an EpiPen.   Insurance and First Injection: We will notify you as soon as your insurance approves the treatment. You will then come in for your first injection, during which I will administer the dose and teach you the self-injection process.   Monitoring and Side Effects: Dupixent  does not require lab monitoring. While it has minimal side effects, rare cases of keratitis have been reported.  Educational Material: You have been provided with educational materials about Dupixent  and its administration for your reference.  Please make sure to keep your follow-up appointment once the treatment is initiated. If you have any questions or concerns in the meantime, do not hesitate to contact our office.  Warm regards,  Dr. Delon Lenis [facility]    Important Information   Due to recent changes in healthcare laws, you may see results of your pathology and/or laboratory studies on MyChart before the doctors have had a chance to review them. We understand that in some cases there may be results that are confusing or concerning to you. Please understand that not all results are received at  the same time and often the doctors may need to interpret multiple results in order to provide you with the best plan of care or course of treatment. Therefore, we ask that you please give us  2 business days to thoroughly review all your results before contacting the office for clarification. Should we see a critical lab result, you will be contacted sooner.     If You Need Anything After Your Visit   If you have any questions or concerns for your doctor, please call our main line at [phone]. If no one answers, please leave a voicemail as directed and we will return your call as soon as possible. Messages left after 4 pm will be answered the following business day.    You may also send us  a message via MyChart. We typically respond to MyChart messages within 1-2 business days.  For prescription refills, please ask your pharmacy to contact our office. Our fax number is [phone].  If you have an urgent issue when the clinic is closed that cannot wait until the next business day, you can page your doctor at the number below.     Please note that while we do our best to be available for urgent issues outside of office hours, we are not available 24/7.    If you have an urgent issue and are unable to reach us , you may choose to seek medical care at your doctor's office, retail clinic, urgent care center, or emergency room.   If you have a medical emergency, please immediately call 911 or go to the emergency department. In the event of inclement weather, please call our main line at [phone] for an update  on the status of any delays or closures.  Dermatology Medication Tips: Please keep the boxes that topical medications come in in order to help keep track of the instructions about where and how to use these. Pharmacies typically print the medication instructions only on the boxes and not directly on the medication tubes.   If your medication is too expensive, please contact our office  at [phone] or send us  a message through MyChart.    We are unable to tell what your co-pay for medications will be in advance as this is different depending on your insurance coverage. However, we may be able to find a substitute medication at lower cost or fill out paperwork to get insurance to cover a needed medication.    If a prior authorization is required to get your medication covered by your insurance company, please allow us  1-2 business days to complete this process.   Drug prices often vary depending on where the prescription is filled and some pharmacies may offer cheaper prices.   The website www.goodrx.com contains coupons for medications through different pharmacies. The prices here do not account for what the cost may be with help from insurance (it may be cheaper with your insurance), but the website can give you the price if you did not use any insurance.  - You can print the associated coupon and take it with your prescription to the pharmacy.  - You may also stop by our office during regular business hours and pick up a GoodRx coupon card.  - If you need your prescription sent electronically to a different pharmacy, notify our office through [facility] or by phone at [phone]

## 2023-09-09 NOTE — Progress Notes (Signed)
   New Patient Visit   Subjective  Kristine Cummings is a 43 y.o. female who presents for the following: New Pt - Atopic dermatitis  Patient states she has atopic dermatitis located at the arms that she would like to have examined. Patient reports the areas have been there for 20 years. She reports the areas are bothersome. Patient rates irritation 10 out of 10. She states that the areas have not spread. Patient reports she has previously been treated for these areas(PCP has prescribed Eucrisa ). Patient reports Hx of bx. Patient denies family history of eczema. Patient reports she has previously tried and failed: TMC, Clobetasol, Betamethasone & Tacrolimus.  The following portions of the chart were reviewed this encounter and updated as appropriate: medications, allergies, medical history  Review of Systems:  No other skin or systemic complaints except as noted in HPI or Assessment and Plan.  Objective  Well appearing patient in no apparent distress; mood and affect are within normal limits.   A focused examination was performed of the following areas: B/L Arms   Relevant exam findings are noted in the Assessment and Plan.    Assessment & Plan   ATOPIC DERMATITIS Exam:  5% BSA Scarring from previous excoriations on B/L Arms  Not at Goal  Atopic dermatitis (eczema) is a chronic, relapsing, pruritic condition that can significantly affect quality of life. It is often associated with allergic rhinitis and/or asthma and can require treatment with topical medications, phototherapy, or in severe cases biologic injectable medication (Dupixent ; Adbry) or Oral JAK inhibitors.  Treatment Plan: - Recommend gentle skin care - Discussed  Dupixent , Forms completed while in office today --> Pt will think about it and consider - We will plan to follow up in 6 months  Pt is not a candidate for methotrexate or cyclosporine due to inability for follow up labs and contraindications with other  medications. Pt has no access to a light box for phototherapy.  Due to the progressive and chronic nature of her atopic Dermatitis, patient has tried and failed numerous topicals creams as noted above, the next best therapeutic option is a systemic therapy. It is medically necessary to help improve her quality of life.   ATOPIC DERMATITIS, UNSPECIFIED TYPE   Related Medications Crisaborole  (EUCRISA ) 2 % OINT Apply 1 Application topically 2 (two) times daily.  Return in about 6 months (around 03/08/2024) for Eczema F/U (Would like to discuss other options than Dupixent .    Documentation: I have reviewed the above documentation for accuracy and completeness, and I agree with the above.  I, Jetta Ager, am acting as scribe for Delon Lenis, DO.   Delon Lenis, DO

## 2024-03-09 ENCOUNTER — Ambulatory Visit: Payer: BC Managed Care – PPO | Admitting: Dermatology
